# Patient Record
Sex: Female | Born: 1945 | Race: White | Hispanic: No | Smoking: Former smoker
Health system: Southern US, Community
[De-identification: ages and names within clinical notes are randomized; demographics above are authoritative.]

## PROBLEM LIST (undated history)

## (undated) DIAGNOSIS — D649 Anemia, unspecified: Secondary | ICD-10-CM

## (undated) DIAGNOSIS — E785 Hyperlipidemia, unspecified: Secondary | ICD-10-CM

## (undated) DIAGNOSIS — I2510 Atherosclerotic heart disease of native coronary artery without angina pectoris: Secondary | ICD-10-CM

## (undated) DIAGNOSIS — E790 Hyperuricemia without signs of inflammatory arthritis and tophaceous disease: Secondary | ICD-10-CM

## (undated) DIAGNOSIS — I4891 Unspecified atrial fibrillation: Secondary | ICD-10-CM

## (undated) DIAGNOSIS — I10 Essential (primary) hypertension: Secondary | ICD-10-CM

## (undated) DIAGNOSIS — N189 Chronic kidney disease, unspecified: Secondary | ICD-10-CM

## (undated) DIAGNOSIS — E039 Hypothyroidism, unspecified: Secondary | ICD-10-CM

## (undated) DIAGNOSIS — F32A Depression, unspecified: Secondary | ICD-10-CM

## (undated) DIAGNOSIS — M109 Gout, unspecified: Secondary | ICD-10-CM

## (undated) HISTORY — PX: TONSILLECTOMY: SUR1361

---

## 1993-01-13 HISTORY — PX: ABDOMINAL HYSTERECTOMY: SHX81

## 2004-09-12 ENCOUNTER — Ambulatory Visit: Payer: Self-pay | Admitting: General Practice

## 2006-05-12 ENCOUNTER — Ambulatory Visit: Payer: Self-pay | Admitting: Unknown Physician Specialty

## 2007-01-22 ENCOUNTER — Emergency Department: Payer: Self-pay | Admitting: Emergency Medicine

## 2007-01-22 ENCOUNTER — Other Ambulatory Visit: Payer: Self-pay

## 2007-03-16 ENCOUNTER — Other Ambulatory Visit: Payer: Self-pay

## 2007-03-16 ENCOUNTER — Emergency Department: Payer: Self-pay | Admitting: Internal Medicine

## 2007-06-22 ENCOUNTER — Ambulatory Visit: Payer: Self-pay

## 2007-06-26 ENCOUNTER — Emergency Department: Payer: Self-pay | Admitting: Emergency Medicine

## 2007-06-26 ENCOUNTER — Other Ambulatory Visit: Payer: Self-pay

## 2010-04-05 ENCOUNTER — Ambulatory Visit: Payer: Self-pay | Admitting: Unknown Physician Specialty

## 2010-05-22 ENCOUNTER — Ambulatory Visit: Payer: Self-pay | Admitting: Unknown Physician Specialty

## 2011-04-15 ENCOUNTER — Ambulatory Visit: Payer: Self-pay | Admitting: Internal Medicine

## 2013-02-22 ENCOUNTER — Ambulatory Visit: Payer: Self-pay | Admitting: Internal Medicine

## 2013-05-16 DIAGNOSIS — I4891 Unspecified atrial fibrillation: Secondary | ICD-10-CM | POA: Diagnosis present

## 2013-08-17 DIAGNOSIS — E785 Hyperlipidemia, unspecified: Secondary | ICD-10-CM | POA: Diagnosis present

## 2013-08-17 DIAGNOSIS — N289 Disorder of kidney and ureter, unspecified: Secondary | ICD-10-CM | POA: Insufficient documentation

## 2013-08-17 DIAGNOSIS — E039 Hypothyroidism, unspecified: Secondary | ICD-10-CM | POA: Insufficient documentation

## 2013-08-17 DIAGNOSIS — E790 Hyperuricemia without signs of inflammatory arthritis and tophaceous disease: Secondary | ICD-10-CM | POA: Insufficient documentation

## 2013-08-17 DIAGNOSIS — E038 Other specified hypothyroidism: Secondary | ICD-10-CM | POA: Insufficient documentation

## 2013-08-17 DIAGNOSIS — I10 Essential (primary) hypertension: Secondary | ICD-10-CM | POA: Diagnosis present

## 2014-01-13 HISTORY — PX: BREAST BIOPSY: SHX20

## 2014-02-22 DIAGNOSIS — Z0001 Encounter for general adult medical examination with abnormal findings: Secondary | ICD-10-CM | POA: Diagnosis not present

## 2014-02-22 DIAGNOSIS — Z1159 Encounter for screening for other viral diseases: Secondary | ICD-10-CM | POA: Diagnosis not present

## 2014-02-22 DIAGNOSIS — I10 Essential (primary) hypertension: Secondary | ICD-10-CM | POA: Diagnosis not present

## 2014-02-22 DIAGNOSIS — E780 Pure hypercholesterolemia: Secondary | ICD-10-CM | POA: Diagnosis not present

## 2014-02-22 DIAGNOSIS — E790 Hyperuricemia without signs of inflammatory arthritis and tophaceous disease: Secondary | ICD-10-CM | POA: Diagnosis not present

## 2014-02-22 DIAGNOSIS — I482 Chronic atrial fibrillation: Secondary | ICD-10-CM | POA: Diagnosis not present

## 2014-02-24 ENCOUNTER — Ambulatory Visit: Payer: Self-pay | Admitting: Internal Medicine

## 2014-02-24 DIAGNOSIS — Z1231 Encounter for screening mammogram for malignant neoplasm of breast: Secondary | ICD-10-CM | POA: Diagnosis not present

## 2014-04-25 ENCOUNTER — Ambulatory Visit
Admit: 2014-04-25 | Disposition: A | Discharge status: Home / Self Care | Payer: Self-pay | Attending: Internal Medicine | Admitting: Internal Medicine

## 2014-04-25 DIAGNOSIS — R928 Other abnormal and inconclusive findings on diagnostic imaging of breast: Secondary | ICD-10-CM | POA: Diagnosis not present

## 2014-04-25 DIAGNOSIS — R921 Mammographic calcification found on diagnostic imaging of breast: Secondary | ICD-10-CM | POA: Diagnosis not present

## 2014-05-04 ENCOUNTER — Ambulatory Visit
Admit: 2014-05-04 | Disposition: A | Discharge status: Home / Self Care | Payer: Self-pay | Attending: Internal Medicine | Admitting: Internal Medicine

## 2014-05-04 DIAGNOSIS — N6021 Fibroadenosis of right breast: Secondary | ICD-10-CM | POA: Diagnosis not present

## 2014-05-04 DIAGNOSIS — R921 Mammographic calcification found on diagnostic imaging of breast: Secondary | ICD-10-CM | POA: Diagnosis not present

## 2014-05-04 DIAGNOSIS — R928 Other abnormal and inconclusive findings on diagnostic imaging of breast: Secondary | ICD-10-CM | POA: Diagnosis not present

## 2014-05-08 LAB — SURGICAL PATHOLOGY

## 2014-06-09 DIAGNOSIS — I482 Chronic atrial fibrillation: Secondary | ICD-10-CM | POA: Diagnosis not present

## 2014-07-11 DIAGNOSIS — I482 Chronic atrial fibrillation: Secondary | ICD-10-CM | POA: Diagnosis not present

## 2014-08-09 DIAGNOSIS — I482 Chronic atrial fibrillation: Secondary | ICD-10-CM | POA: Diagnosis not present

## 2014-08-16 DIAGNOSIS — I482 Chronic atrial fibrillation: Secondary | ICD-10-CM | POA: Diagnosis not present

## 2014-08-16 DIAGNOSIS — I10 Essential (primary) hypertension: Secondary | ICD-10-CM | POA: Diagnosis not present

## 2014-08-31 DIAGNOSIS — E039 Hypothyroidism, unspecified: Secondary | ICD-10-CM | POA: Diagnosis not present

## 2014-08-31 DIAGNOSIS — I482 Chronic atrial fibrillation: Secondary | ICD-10-CM | POA: Diagnosis not present

## 2014-08-31 DIAGNOSIS — I10 Essential (primary) hypertension: Secondary | ICD-10-CM | POA: Diagnosis not present

## 2014-08-31 DIAGNOSIS — E790 Hyperuricemia without signs of inflammatory arthritis and tophaceous disease: Secondary | ICD-10-CM | POA: Diagnosis not present

## 2014-09-14 DIAGNOSIS — I482 Chronic atrial fibrillation: Secondary | ICD-10-CM | POA: Diagnosis not present

## 2014-10-12 DIAGNOSIS — I482 Chronic atrial fibrillation: Secondary | ICD-10-CM | POA: Diagnosis not present

## 2014-10-20 DIAGNOSIS — Z23 Encounter for immunization: Secondary | ICD-10-CM | POA: Diagnosis not present

## 2014-11-13 DIAGNOSIS — I482 Chronic atrial fibrillation: Secondary | ICD-10-CM | POA: Diagnosis not present

## 2014-12-13 DIAGNOSIS — H2513 Age-related nuclear cataract, bilateral: Secondary | ICD-10-CM | POA: Diagnosis not present

## 2014-12-13 DIAGNOSIS — H04129 Dry eye syndrome of unspecified lacrimal gland: Secondary | ICD-10-CM | POA: Diagnosis not present

## 2015-02-26 DIAGNOSIS — I10 Essential (primary) hypertension: Secondary | ICD-10-CM | POA: Diagnosis not present

## 2015-02-26 DIAGNOSIS — E039 Hypothyroidism, unspecified: Secondary | ICD-10-CM | POA: Diagnosis not present

## 2015-02-26 DIAGNOSIS — Z23 Encounter for immunization: Secondary | ICD-10-CM | POA: Diagnosis not present

## 2015-02-26 DIAGNOSIS — I482 Chronic atrial fibrillation: Secondary | ICD-10-CM | POA: Diagnosis not present

## 2015-02-26 DIAGNOSIS — Z0001 Encounter for general adult medical examination with abnormal findings: Secondary | ICD-10-CM | POA: Diagnosis not present

## 2015-02-26 DIAGNOSIS — E790 Hyperuricemia without signs of inflammatory arthritis and tophaceous disease: Secondary | ICD-10-CM | POA: Diagnosis not present

## 2015-02-26 DIAGNOSIS — E7800 Pure hypercholesterolemia, unspecified: Secondary | ICD-10-CM | POA: Diagnosis not present

## 2015-02-26 DIAGNOSIS — N289 Disorder of kidney and ureter, unspecified: Secondary | ICD-10-CM | POA: Diagnosis not present

## 2015-05-18 DIAGNOSIS — I10 Essential (primary) hypertension: Secondary | ICD-10-CM | POA: Diagnosis not present

## 2015-05-25 DIAGNOSIS — N289 Disorder of kidney and ureter, unspecified: Secondary | ICD-10-CM | POA: Diagnosis not present

## 2015-05-25 DIAGNOSIS — I482 Chronic atrial fibrillation: Secondary | ICD-10-CM | POA: Diagnosis not present

## 2015-05-25 DIAGNOSIS — I10 Essential (primary) hypertension: Secondary | ICD-10-CM | POA: Diagnosis not present

## 2015-08-28 DIAGNOSIS — I10 Essential (primary) hypertension: Secondary | ICD-10-CM | POA: Diagnosis not present

## 2015-08-28 DIAGNOSIS — I482 Chronic atrial fibrillation: Secondary | ICD-10-CM | POA: Diagnosis not present

## 2015-08-29 DIAGNOSIS — I10 Essential (primary) hypertension: Secondary | ICD-10-CM | POA: Diagnosis not present

## 2015-12-08 ENCOUNTER — Emergency Department
Admission: EM | Admit: 2015-12-08 | Discharge: 2015-12-08 | Disposition: A | Discharge status: Home / Self Care | Payer: Medicare HMO | Attending: Emergency Medicine | Admitting: Emergency Medicine

## 2015-12-08 ENCOUNTER — Emergency Department: Payer: Medicare HMO

## 2015-12-08 ENCOUNTER — Encounter: Payer: Self-pay | Admitting: Emergency Medicine

## 2015-12-08 DIAGNOSIS — R1084 Generalized abdominal pain: Secondary | ICD-10-CM | POA: Diagnosis not present

## 2015-12-08 DIAGNOSIS — I10 Essential (primary) hypertension: Secondary | ICD-10-CM | POA: Insufficient documentation

## 2015-12-08 DIAGNOSIS — Z79899 Other long term (current) drug therapy: Secondary | ICD-10-CM | POA: Insufficient documentation

## 2015-12-08 DIAGNOSIS — I2510 Atherosclerotic heart disease of native coronary artery without angina pectoris: Secondary | ICD-10-CM | POA: Insufficient documentation

## 2015-12-08 DIAGNOSIS — Z7982 Long term (current) use of aspirin: Secondary | ICD-10-CM | POA: Diagnosis not present

## 2015-12-08 DIAGNOSIS — Z87891 Personal history of nicotine dependence: Secondary | ICD-10-CM | POA: Insufficient documentation

## 2015-12-08 DIAGNOSIS — R112 Nausea with vomiting, unspecified: Secondary | ICD-10-CM | POA: Insufficient documentation

## 2015-12-08 DIAGNOSIS — R197 Diarrhea, unspecified: Secondary | ICD-10-CM | POA: Diagnosis not present

## 2015-12-08 DIAGNOSIS — K8020 Calculus of gallbladder without cholecystitis without obstruction: Secondary | ICD-10-CM | POA: Diagnosis not present

## 2015-12-08 HISTORY — DX: Essential (primary) hypertension: I10

## 2015-12-08 HISTORY — DX: Unspecified atrial fibrillation: I48.91

## 2015-12-08 HISTORY — DX: Atherosclerotic heart disease of native coronary artery without angina pectoris: I25.10

## 2015-12-08 LAB — CBC
HEMATOCRIT: 40 % (ref 35.0–47.0)
HEMOGLOBIN: 13.9 g/dL (ref 12.0–16.0)
MCH: 30.3 pg (ref 26.0–34.0)
MCHC: 34.7 g/dL (ref 32.0–36.0)
MCV: 87.2 fL (ref 80.0–100.0)
Platelets: 210 10*3/uL (ref 150–440)
RBC: 4.59 MIL/uL (ref 3.80–5.20)
RDW: 14.1 % (ref 11.5–14.5)
WBC: 9.9 10*3/uL (ref 3.6–11.0)

## 2015-12-08 LAB — COMPREHENSIVE METABOLIC PANEL
ALBUMIN: 4.1 g/dL (ref 3.5–5.0)
ALK PHOS: 91 U/L (ref 38–126)
ALT: 23 U/L (ref 14–54)
ANION GAP: 8 (ref 5–15)
AST: 30 U/L (ref 15–41)
BUN: 19 mg/dL (ref 6–20)
CO2: 29 mmol/L (ref 22–32)
Calcium: 9.9 mg/dL (ref 8.9–10.3)
Chloride: 100 mmol/L — ABNORMAL LOW (ref 101–111)
Creatinine, Ser: 1.03 mg/dL — ABNORMAL HIGH (ref 0.44–1.00)
GFR calc Af Amer: >60 mL/min (ref >60)
GFR calc non Af Amer: 53 mL/min — ABNORMAL LOW (ref >60)
GLUCOSE: 116 mg/dL — AB (ref 65–99)
POTASSIUM: 3.2 mmol/L — AB (ref 3.5–5.1)
SODIUM: 137 mmol/L (ref 135–145)
Total Bilirubin: 0.6 mg/dL (ref 0.3–1.2)
Total Protein: 7.3 g/dL (ref 6.5–8.1)

## 2015-12-08 LAB — URINALYSIS COMPLETE WITH MICROSCOPIC (ARMC ONLY)
BACTERIA UA: NONE SEEN
BILIRUBIN URINE: NEGATIVE
Glucose, UA: NEGATIVE mg/dL
Hgb urine dipstick: NEGATIVE
Ketones, ur: NEGATIVE mg/dL
Leukocytes, UA: NEGATIVE
NITRITE: NEGATIVE
Protein, ur: NEGATIVE mg/dL
Specific Gravity, Urine: 1.011 (ref 1.005–1.030)
pH: 7 (ref 5.0–8.0)

## 2015-12-08 LAB — LIPASE, BLOOD: Lipase: 29 U/L (ref 11–51)

## 2015-12-08 LAB — TROPONIN I

## 2015-12-08 MED ORDER — IOPAMIDOL (ISOVUE-300) INJECTION 61%
100.0000 mL | Freq: Once | INTRAVENOUS | Status: AC | PRN
  Administered 2015-12-08: 100 mL via INTRAVENOUS

## 2015-12-08 MED ORDER — SODIUM CHLORIDE 0.9 % IV BOLUS (SEPSIS)
500.0000 mL | Freq: Once | INTRAVENOUS | Status: AC
  Administered 2015-12-08: 500 mL via INTRAVENOUS

## 2015-12-08 MED ORDER — ONDANSETRON HCL 4 MG/2ML IJ SOLN
4.0000 mg | Freq: Once | INTRAMUSCULAR | Status: AC
  Administered 2015-12-08: 4 mg via INTRAVENOUS
  Filled 2015-12-08: qty 2

## 2015-12-08 MED ORDER — DICYCLOMINE HCL 20 MG PO TABS: 20.0000 mg | ORAL_TABLET | Freq: Three times a day (TID) | ORAL | 0 refills | Status: DC | PRN

## 2015-12-08 MED ORDER — IOPAMIDOL (ISOVUE-300) INJECTION 61%
30.0000 mL | Freq: Once | INTRAVENOUS | Status: AC | PRN
  Administered 2015-12-08: 30 mL via ORAL

## 2015-12-08 MED ORDER — ONDANSETRON HCL 4 MG PO TABS: 4.0000 mg | ORAL_TABLET | Freq: Three times a day (TID) | ORAL | 0 refills | Status: AC | PRN

## 2015-12-08 MED ORDER — HYDROMORPHONE HCL 1 MG/ML IJ SOLN
0.5000 mg | Freq: Once | INTRAMUSCULAR | Status: AC
  Administered 2015-12-08: 0.5 mg via INTRAVENOUS
  Filled 2015-12-08: qty 1

## 2015-12-08 NOTE — ED Triage Notes (Signed)
Having lower abd and back pain with n/v/ and d about 3 am

## 2015-12-08 NOTE — ED Provider Notes (Signed)
Time Seen: Approximately 1153  I have reviewed the triage notes  Chief Complaint: Abdominal Pain and Hematemesis   History of Present Illness: JAALA Allen is a 70 y.o. female *who describes diffuse abdominal pain with radiation toward the back since approximately 3 AM this morning. She states she's had some persistent nausea and dry heaves. One episode of emesis. To this historian she states it did not appear to be blood was likely some over-the-counter medication that she took. The patient points diffusely to the abdominal region and occasionally she'll point to the epigastric area as the initiation of her pain with pain toward her right flank and then on further questioning she'll point more toward the. Umbilical and lower middle quadrant as the source of discomfort. She's had for loose stools with no melena or hematochezia. She denies any chest pain or shortness of breath. She denies any urinary complaints.   Past Medical History:  Diagnosis Date  . Atrial fibrillation ([place])   . Coronary artery disease   . Hypertension     There are no active problems to display for this patient.   Past Surgical History:  Procedure Laterality Date  . ABDOMINAL HYSTERECTOMY      Past Surgical History:  Procedure Laterality Date  . ABDOMINAL HYSTERECTOMY    Complete hysterectomy    Allergies:  Patient has no known allergies.  Family History: No family history on file.  Social History: Social History  Substance Use Topics  . Smoking status: Former Research scientist (life sciences)  . Smokeless tobacco: Never Used  . Alcohol use No     Review of Systems:   10 point review of systems was performed and was otherwise negative:  Constitutional: No fever Eyes: No visual disturbances ENT: No sore throat, ear pain Cardiac: No chest pain Respiratory: No shortness of breath, wheezing, or stridor Abdomen: Diffuse abdominal pain with persistent nausea with vomiting 1. Loose stool Endocrine: No weight  loss, No night sweats Extremities: No peripheral edema, cyanosis Skin: No rashes, easy bruising Neurologic: No focal weakness, trouble with speech or swollowing Urologic: No dysuria, Hematuria, or urinary frequency Patient denies being on any recent antibiotics and denies travel  Physical Exam:  ED Triage Vitals [12/08/15 1130]  Enc Vitals Group     BP (!) 144/83     Pulse Rate 92     Resp 20     Temp 97.7 F (36.5 C)     Temp Source Oral     SpO2 98 %     Weight      Height      Head Circumference      Peak Flow      Pain Score 6     Pain Loc      Pain Edu?      Excl. in [place]?     General: Awake , Alert , and Oriented times 3; GCS 15 Head: Normal cephalic , atraumatic Eyes: Pupils equal , round, reactive to light Nose/Throat: No nasal drainage, patent upper airway without erythema or exudate.  Neck: Supple, Full range of motion, No anterior adenopathy or palpable thyroid masses Lungs: Clear to ascultation without wheezes , rhonchi, or rales Heart: Regular rate, regular rhythm without murmurs , gallops , or rubs Abdomen: Diffuse reproducible abdominal pain primarily epigastric and lower middle quadrant region. Bowel sounds are positive somewhat hyperactive in all 4 quadrants. No organosplenomegaly. Negative Murphy's sign or focal tenderness over McBurney's point.        Extremities: 2 plus  symmetric pulses. No edema, clubbing or cyanosis Neurologic: normal ambulation, Motor symmetric without deficits, sensory intact Skin: warm, dry, no rashes   Labs:   All laboratory work was reviewed including any pertinent negatives or positives listed below:  Labs Reviewed  CBC  LIPASE, [place] ([place])    EKG: * ED ECG REPORT I, Daymon Larsen, the attending physician, personally viewed and interpreted this ECG.  Date: 12/08/2015 EKG Time: 1216 Rate: 84 Rhythm: Atrial fibrillation QRS Axis:  normal Intervals: Right bundle-branch block ST/T Wave abnormalities: normal Conduction Disturbances: none Narrative Interpretation: unremarkable No acute ischemic changes  Radiology:  "Ct Abdomen Pelvis W Contrast  Result Date: 12/08/2015 CLINICAL DATA:  Epigastric pain with nausea and vomiting EXAM: CT ABDOMEN AND PELVIS WITH CONTRAST TECHNIQUE: Multidetector CT imaging of the abdomen and pelvis was performed using the standard protocol following bolus administration of intravenous contrast. CONTRAST:  161mL ISOVUE-300 IOPAMIDOL (ISOVUE-300) INJECTION 61% COMPARISON:  None. FINDINGS: Lower chest: No acute abnormality. Hepatobiliary: Scattered hypodensities are noted throughout the liver consistent with tiny cysts. They measure less than 1 cm. The gallbladder is within normal limits with the exception of a single dependent gallstone in the region of the gallbladder neck. No pericholecystic fluid or wall thickening is noted. Pancreas: Unremarkable. No pancreatic ductal dilatation or surrounding inflammatory changes. Spleen: Normal in size without focal abnormality. Adrenals/Urinary Tract: Adrenal glands are unremarkable. Kidneys are normal, without renal calculi, focal lesion, or hydronephrosis. Bladder is unremarkable. Stomach/Bowel: Stomach is within normal limits. Appendix appears normal. No evidence of bowel wall thickening, distention, or inflammatory changes. Mild diverticular changes noted. Vascular/Lymphatic: No significant vascular findings are present. No enlarged abdominal or pelvic lymph nodes. Reproductive: Status post hysterectomy. No adnexal masses. Other: No abdominal wall hernia or abnormality. No abdominopelvic ascites. Musculoskeletal: No acute or significant osseous findings. IMPRESSION: Cholelithiasis without complicating factors. Diverticulosis without diverticulitis. No acute abnormality noted. Electronically Signed   By: Inez Catalina M.D.   On: 12/08/2015 13:57  "   I personally  reviewed the radiologic studies    ED Course: Patient's stay here was uneventful. Patient has no signs of a surgical etiology for the abdominal pain per CAT scan evaluation. She was given some anti-medic therapy along with some pain medication for diffuse crampy abdominal discomfort. Patient feels symptomatically improved at this time and with her normal lab work and negative CAT scan and repeat exam being benign and I felt she could be treated on an outpatient basis. Clinical Course      Assessment: * Acute unspecified abdominal pain Gastroenteritis      Plan: * Outpatient " New Prescriptions   DICYCLOMINE (BENTYL) 20 MG TABLET    Take 1 tablet (20 mg total) by mouth 3 (three) times daily as needed for spasms.   ONDANSETRON (ZOFRAN) 4 MG TABLET    Take 1 tablet (4 mg total) by mouth every 8 (eight) hours as needed for nausea or vomiting.  " Patient was advised to return immediately if condition worsens. Patient was advised to follow up with their primary care physician or other specialized physicians involved in their outpatient care. The patient and/or family member/power of attorney had laboratory results reviewed at the bedside. All questions and concerns were addressed and appropriate discharge instructions were distributed by the nursing staff.            Daymon Larsen, MD 12/08/15 1455

## 2015-12-08 NOTE — Discharge Instructions (Signed)
Please return the emergency department especially for fever, uncontrolled vomiting, bloody diarrhea, or any other new concerns.  Please return immediately if condition worsens. Please contact her primary physician or the physician you were given for referral. If you have any specialist physicians involved in her treatment and plan please also contact them. Thank you for using [facility] regional emergency Department.

## 2015-12-11 DIAGNOSIS — H2513 Age-related nuclear cataract, bilateral: Secondary | ICD-10-CM | POA: Diagnosis not present

## 2015-12-11 DIAGNOSIS — H04129 Dry eye syndrome of unspecified lacrimal gland: Secondary | ICD-10-CM | POA: Diagnosis not present

## 2015-12-12 DIAGNOSIS — K8020 Calculus of gallbladder without cholecystitis without obstruction: Secondary | ICD-10-CM | POA: Diagnosis not present

## 2015-12-14 ENCOUNTER — Encounter: Payer: Self-pay | Admitting: Emergency Medicine

## 2015-12-14 ENCOUNTER — Inpatient Hospital Stay
Admission: EM | Admit: 2015-12-14 | Discharge: 2015-12-18 | DRG: 417 | Disposition: A | Discharge status: Home / Self Care | Payer: Commercial Managed Care - HMO | Attending: Internal Medicine | Admitting: Internal Medicine

## 2015-12-14 ENCOUNTER — Emergency Department: Payer: Commercial Managed Care - HMO

## 2015-12-14 DIAGNOSIS — K8000 Calculus of gallbladder with acute cholecystitis without obstruction: Secondary | ICD-10-CM | POA: Diagnosis present

## 2015-12-14 DIAGNOSIS — R7989 Other specified abnormal findings of blood chemistry: Secondary | ICD-10-CM | POA: Diagnosis not present

## 2015-12-14 DIAGNOSIS — R945 Abnormal results of liver function studies: Secondary | ICD-10-CM

## 2015-12-14 DIAGNOSIS — N390 Urinary tract infection, site not specified: Secondary | ICD-10-CM | POA: Diagnosis present

## 2015-12-14 DIAGNOSIS — Z8249 Family history of ischemic heart disease and other diseases of the circulatory system: Secondary | ICD-10-CM

## 2015-12-14 DIAGNOSIS — I2510 Atherosclerotic heart disease of native coronary artery without angina pectoris: Secondary | ICD-10-CM | POA: Diagnosis present

## 2015-12-14 DIAGNOSIS — E785 Hyperlipidemia, unspecified: Secondary | ICD-10-CM | POA: Diagnosis present

## 2015-12-14 DIAGNOSIS — K810 Acute cholecystitis: Secondary | ICD-10-CM | POA: Diagnosis not present

## 2015-12-14 DIAGNOSIS — Z79899 Other long term (current) drug therapy: Secondary | ICD-10-CM | POA: Diagnosis not present

## 2015-12-14 DIAGNOSIS — I4891 Unspecified atrial fibrillation: Secondary | ICD-10-CM | POA: Diagnosis present

## 2015-12-14 DIAGNOSIS — Z7982 Long term (current) use of aspirin: Secondary | ICD-10-CM

## 2015-12-14 DIAGNOSIS — K830 Cholangitis: Secondary | ICD-10-CM | POA: Diagnosis present

## 2015-12-14 DIAGNOSIS — E878 Other disorders of electrolyte and fluid balance, not elsewhere classified: Secondary | ICD-10-CM | POA: Diagnosis not present

## 2015-12-14 DIAGNOSIS — E876 Hypokalemia: Secondary | ICD-10-CM | POA: Diagnosis not present

## 2015-12-14 DIAGNOSIS — K819 Cholecystitis, unspecified: Secondary | ICD-10-CM | POA: Diagnosis not present

## 2015-12-14 DIAGNOSIS — K8020 Calculus of gallbladder without cholecystitis without obstruction: Secondary | ICD-10-CM | POA: Diagnosis not present

## 2015-12-14 DIAGNOSIS — K822 Perforation of gallbladder: Secondary | ICD-10-CM | POA: Diagnosis present

## 2015-12-14 DIAGNOSIS — Z880 Allergy status to penicillin: Secondary | ICD-10-CM

## 2015-12-14 DIAGNOSIS — N179 Acute kidney failure, unspecified: Secondary | ICD-10-CM | POA: Diagnosis present

## 2015-12-14 DIAGNOSIS — R935 Abnormal findings on diagnostic imaging of other abdominal regions, including retroperitoneum: Secondary | ICD-10-CM | POA: Diagnosis not present

## 2015-12-14 DIAGNOSIS — E860 Dehydration: Secondary | ICD-10-CM | POA: Diagnosis not present

## 2015-12-14 DIAGNOSIS — R1011 Right upper quadrant pain: Secondary | ICD-10-CM

## 2015-12-14 DIAGNOSIS — Z87891 Personal history of nicotine dependence: Secondary | ICD-10-CM | POA: Diagnosis not present

## 2015-12-14 DIAGNOSIS — K8012 Calculus of gallbladder with acute and chronic cholecystitis without obstruction: Secondary | ICD-10-CM | POA: Diagnosis not present

## 2015-12-14 DIAGNOSIS — I10 Essential (primary) hypertension: Secondary | ICD-10-CM | POA: Diagnosis present

## 2015-12-14 DIAGNOSIS — R109 Unspecified abdominal pain: Secondary | ICD-10-CM

## 2015-12-14 DIAGNOSIS — N3000 Acute cystitis without hematuria: Secondary | ICD-10-CM | POA: Diagnosis not present

## 2015-12-14 HISTORY — DX: Hyperlipidemia, unspecified: E78.5

## 2015-12-14 LAB — COMPREHENSIVE METABOLIC PANEL
ALBUMIN: 3.2 g/dL — AB (ref 3.5–5.0)
ALK PHOS: 215 U/L — AB (ref 38–126)
ALT: 37 U/L (ref 14–54)
AST: 57 U/L — ABNORMAL HIGH (ref 15–41)
Anion gap: 12 (ref 5–15)
BUN: 22 mg/dL — ABNORMAL HIGH (ref 6–20)
CALCIUM: 9.4 mg/dL (ref 8.9–10.3)
CO2: 27 mmol/L (ref 22–32)
CREATININE: 1.63 mg/dL — AB (ref 0.44–1.00)
Chloride: 88 mmol/L — ABNORMAL LOW (ref 101–111)
GFR calc Af Amer: 36 mL/min — ABNORMAL LOW (ref >60)
GFR calc non Af Amer: 31 mL/min — ABNORMAL LOW (ref >60)
GLUCOSE: 116 mg/dL — AB (ref 65–99)
Potassium: 2.9 mmol/L — ABNORMAL LOW (ref 3.5–5.1)
SODIUM: 127 mmol/L — AB (ref 135–145)
Total Bilirubin: 2.4 mg/dL — ABNORMAL HIGH (ref 0.3–1.2)
Total Protein: 7.2 g/dL (ref 6.5–8.1)

## 2015-12-14 LAB — URINALYSIS COMPLETE WITH MICROSCOPIC (ARMC ONLY)
Bilirubin Urine: NEGATIVE
GLUCOSE, UA: NEGATIVE mg/dL
Ketones, ur: NEGATIVE mg/dL
Nitrite: NEGATIVE
PROTEIN: 30 mg/dL — AB
Specific Gravity, Urine: 1.016 (ref 1.005–1.030)
pH: 5 (ref 5.0–8.0)

## 2015-12-14 LAB — CBC
HCT: 34.8 % — ABNORMAL LOW (ref 35.0–47.0)
HEMOGLOBIN: 11.9 g/dL — AB (ref 12.0–16.0)
MCH: 29.6 pg (ref 26.0–34.0)
MCHC: 34.2 g/dL (ref 32.0–36.0)
MCV: 86.7 fL (ref 80.0–100.0)
PLATELETS: 274 10*3/uL (ref 150–440)
RBC: 4.01 MIL/uL (ref 3.80–5.20)
RDW: 13.4 % (ref 11.5–14.5)
WBC: 12.4 10*3/uL — ABNORMAL HIGH (ref 3.6–11.0)

## 2015-12-14 LAB — LIPASE, BLOOD: Lipase: 35 U/L (ref 11–51)

## 2015-12-14 MED ORDER — POTASSIUM CHLORIDE CRYS ER 20 MEQ PO TBCR
20.0000 meq | EXTENDED_RELEASE_TABLET | Freq: Once | ORAL | Status: AC
  Administered 2015-12-14: 20 meq via ORAL
  Filled 2015-12-14: qty 1

## 2015-12-14 MED ORDER — CEFTRIAXONE SODIUM-DEXTROSE 1-3.74 GM-% IV SOLR
INTRAVENOUS | Status: AC
  Administered 2015-12-14: 1000 mg
  Filled 2015-12-14: qty 50

## 2015-12-14 MED ORDER — SODIUM CHLORIDE 0.9 % IV BOLUS (SEPSIS)
500.0000 mL | Freq: Once | INTRAVENOUS | Status: AC
  Administered 2015-12-14: 500 mL via INTRAVENOUS

## 2015-12-14 MED ORDER — DEXTROSE 5 % IV SOLN
1.0000 g | Freq: Once | INTRAVENOUS | Status: AC
  Administered 2015-12-14: 1 g via INTRAVENOUS

## 2015-12-14 NOTE — ED Triage Notes (Signed)
Pt reports ongoing RUQ and RLQ abdominal pain. Pt states has appt with general surgeon on 12/17 to discuss the need for surgery. Pt reports fever for 2-3 days up to 102.2. Pt reports when she takes tylenol her fever goes down. Pt denies NVD. Pt denies any cold or flu symptoms.

## 2015-12-14 NOTE — ED Notes (Signed)
Patient transported to Ultrasound 

## 2015-12-14 NOTE — ED Notes (Signed)
Pt's family requesting to speak to MD, MD made aware

## 2015-12-14 NOTE — ED Provider Notes (Signed)
Time Seen: Approximately 2114  I have reviewed the triage notes  Chief Complaint: Fever and Abdominal Pain   History of Present Illness: Robin LEVAR is a 70 y.o. female *who was brought back here to the emergency department secondary to some abdominal pain and a fever at home. Patient's had fever over the last couple of days up to 102.2. She points primarily to the epigastric area and right upper quadrant as the source of her discomfort. He evaluated the patient recently and she had an abdominal pelvic CT which showed cholelithiasis but no evidence of any acute abdominal disorder. She apparently has an upcoming appointment with a surgeon but has not been officially seen by a surgeon at this time. 69 chest pain or productive cough or wheezing.   Past Medical History:  Diagnosis Date  . Atrial fibrillation ([place])   . Coronary artery disease   . Hypertension     There are no active problems to display for this patient.   Past Surgical History:  Procedure Laterality Date  . ABDOMINAL HYSTERECTOMY      Past Surgical History:  Procedure Laterality Date  . ABDOMINAL HYSTERECTOMY      Current Outpatient Rx  . Order #: [id number] Class: Historical Med  . Order #: [id number] Class: Historical Med  . Order #: [id number] Class: Print  . Order #: [id number] Class: Historical Med  . Order #: [id number] Class: Historical Med  . Order #: [id number] Class: Print  . Order #: [id number] Class: Historical Med    Allergies:  Augmentin [amoxicillin-pot clavulanate]  Family History: No family history on file.  Social History: Social History  Substance Use Topics  . Smoking status: Former Research scientist (life sciences)  . Smokeless tobacco: Never Used  . Alcohol use No     Review of Systems:   10 point review of systems was performed and was otherwise negative:  Constitutional: No fever Eyes: No visual disturbances ENT: No sore throat, ear pain Cardiac: No chest pain Respiratory: No shortness of breath,  wheezing, or stridor Abdomen: Patient points primarily to the epigastric and toward her right upper quadrant as the source of her abdominal pain. She states currently her pain seems to be doing well. Endocrine: No weight loss, No night sweats Extremities: No peripheral edema, cyanosis Skin: No rashes, easy bruising Neurologic: No focal weakness, trouble with speech or swollowing Urologic: No dysuria, Hematuria, or urinary frequency   Physical Exam:  ED Triage Vitals  Enc Vitals Group     BP 12/14/15 1903 121/79     Pulse Rate 12/14/15 1903 (!) 109     Resp 12/14/15 1903 (!) 178     Temp 12/14/15 1903 98.2 F (36.8 C)     Temp Source 12/14/15 1903 Oral     SpO2 12/14/15 1903 98 %     Weight 12/14/15 1904 181 lb (82.1 kg)     Height 12/14/15 1904 5\' 5"  (1.651 m)     Head Circumference --      Peak Flow --      Pain Score 12/14/15 1904 7     Pain Loc --      Pain Edu? --      Excl. in [place]? --     General: Awake , Alert , and Oriented times 3; GCS 15 Head: Normal cephalic , atraumatic Eyes: Pupils equal , round, reactive to light Nose/Throat: No nasal drainage, patent upper airway without erythema or exudate.  Neck: Supple, Full range of motion, No anterior adenopathy or palpable thyroid masses Lungs: Clear  to ascultation without wheezes , rhonchi, or rales Heart: Regular rate, regular rhythm without murmurs , gallops , or rubs Abdomen:Patient has tenderness epigastric right upper quadrant without rebound guarding rigidity or given can O splenomegaly. Bowel sounds are positive and symmetric in all 4 areas.     Extremities: 2 plus symmetric pulses. No edema, clubbing or cyanosis Neurologic: normal ambulation, Motor symmetric without deficits, sensory intact Skin: warm, dry, no rashes   Labs:   All laboratory work was reviewed including any pertinent negatives or positives listed below:  Labs Reviewed  COMPREHENSIVE METABOLIC PANEL - Abnormal; Notable for the following:        Result Value   Sodium 127 (*)    Potassium 2.9 (*)    Chloride 88 (*)    Glucose, Bld 116 (*)    BUN 22 (*)    Creatinine, Ser 1.63 (*)    Albumin 3.2 (*)    AST 57 (*)    Alkaline Phosphatase 215 (*)    Total Bilirubin 2.4 (*)    GFR calc non Af Amer 31 (*)    GFR calc Af Amer 36 (*)    All other components within normal limits  CBC - Abnormal; Notable for the following:    WBC 12.4 (*)    Hemoglobin 11.9 (*)    HCT 34.8 (*)    All other components within normal limits  URINALYSIS COMPLETEWITH MICROSCOPIC ([facility] ONLY) - Abnormal; Notable for the following:    Color, Urine AMBER (*)    APPearance CLEAR (*)    Hgb urine dipstick 1+ (*)    Protein, ur 30 (*)    Leukocytes, UA 3+ (*)    Bacteria, UA RARE (*)    Squamous Epithelial / LPF 0-5 (*)    All other components within normal limits  URINE CULTURE  LIPASE, BLOOD  Lab work shows a low sodium and an increasing creatinine from previous labs. She also has some elevated liver function panels with catheter alkaline phosphatase of 215 and total bilirubin of 2.4 white blood cell count is also elevated in comparison with signs of urinary tract infection. Urine culture is pending.   Radiology:  "Ct Abdomen Pelvis W Contrast  Result Date: 12/08/2015 CLINICAL DATA:  Epigastric pain with nausea and vomiting EXAM: CT ABDOMEN AND PELVIS WITH CONTRAST TECHNIQUE: Multidetector CT imaging of the abdomen and pelvis was performed using the standard protocol following bolus administration of intravenous contrast. CONTRAST:  120mL ISOVUE-300 IOPAMIDOL (ISOVUE-300) INJECTION 61% COMPARISON:  None. FINDINGS: Lower chest: No acute abnormality. Hepatobiliary: Scattered hypodensities are noted throughout the liver consistent with tiny cysts. They measure less than 1 cm. The gallbladder is within normal limits with the exception of a single dependent gallstone in the region of the gallbladder neck. No pericholecystic fluid or wall thickening is noted.  Pancreas: Unremarkable. No pancreatic ductal dilatation or surrounding inflammatory changes. Spleen: Normal in size without focal abnormality. Adrenals/Urinary Tract: Adrenal glands are unremarkable. Kidneys are normal, without renal calculi, focal lesion, or hydronephrosis. Bladder is unremarkable. Stomach/Bowel: Stomach is within normal limits. Appendix appears normal. No evidence of bowel wall thickening, distention, or inflammatory changes. Mild diverticular changes noted. Vascular/Lymphatic: No significant vascular findings are present. No enlarged abdominal or pelvic lymph nodes. Reproductive: Status post hysterectomy. No adnexal masses. Other: No abdominal wall hernia or abnormality. No abdominopelvic ascites. Musculoskeletal: No acute or significant osseous findings. IMPRESSION: Cholelithiasis without complicating factors. Diverticulosis without diverticulitis. No acute abnormality noted. Electronically Signed   By: Elta [place]  Lukens M.D.   On: 12/08/2015 13:57   US Abdomen Limited Ruq  Result Date: 12/14/2015 CLINICAL DATA:  Right upper quadrant pain EXAM: US ABDOMEN LIMITED - RIGHT UPPER QUADRANT COMPARISON:  CT 12/08/2015 FINDINGS: Gallbladder: Gallbladder is slightly dilated measuring up to 4.6 cm in diameter. Moderate sludge is present within the lumen with small echodense stones. Additional echodense foci within the wall of the gallbladder compatible with adenomyomatosis. Gallbladder wall is slightly thickened at 4 mm. Small amount of peri cholecystic fluid. Sonographer reports positive sonographic Murphy's sign. Questionable focal defect within the inferior wall of the gallbladder with surrounding sludge. Common bile duct: Diameter: Normal at 3.1 mm Liver: No focal lesion identified. Within normal limits in parenchymal echogenicity. Cysts noted on prior CT are not well demonstrated. IMPRESSION: 1. Dilated gallbladder containing moderate sludge and stones. There is wall thickening, small peri  cholecystic fluid and positive sonographic Murphy's, collective findings would be suspicious for acute cholecystitis. Questionable small focal area of wall discontinuity involving the dependent portion of the gallbladder, a small perforation cannot be excluded. 2. No biliary dilatation Electronically Signed   By: Donavan Foil M.D.   On: 12/14/2015 23:18  "  I personally reviewed the radiologic studies     ED Course:  Patient does have an obvious urinary tract infection and urine culture was added and the patient received a gram of Rocephin IV. Equivocal findings per ultrasound evaluation but certainly laboratory work that appears to be some form of biliary tract disease. The patient's case was reviewed with general surgery and possible internal medicine admission for correction of electrolytes, etc. And otherwise has some occasional discomfort but otherwise appears to be comfortable at this time and tolerated her IV antibiotics well Clinical Course      Assessment: * Acute unspecified abdominal pain Urinary tract infection  Final Clinical Impression:   Final diagnoses:  Abdominal pain     Plan: Inpatient management*            Daymon Larsen, MD 12/14/15 2338

## 2015-12-15 ENCOUNTER — Encounter: Payer: Self-pay | Admitting: Surgery

## 2015-12-15 ENCOUNTER — Inpatient Hospital Stay: Payer: Commercial Managed Care - HMO

## 2015-12-15 DIAGNOSIS — E878 Other disorders of electrolyte and fluid balance, not elsewhere classified: Secondary | ICD-10-CM | POA: Insufficient documentation

## 2015-12-15 DIAGNOSIS — K830 Cholangitis: Secondary | ICD-10-CM | POA: Diagnosis present

## 2015-12-15 DIAGNOSIS — E876 Hypokalemia: Secondary | ICD-10-CM | POA: Diagnosis not present

## 2015-12-15 DIAGNOSIS — K8020 Calculus of gallbladder without cholecystitis without obstruction: Secondary | ICD-10-CM | POA: Diagnosis not present

## 2015-12-15 DIAGNOSIS — K810 Acute cholecystitis: Secondary | ICD-10-CM | POA: Diagnosis not present

## 2015-12-15 DIAGNOSIS — E860 Dehydration: Secondary | ICD-10-CM | POA: Insufficient documentation

## 2015-12-15 DIAGNOSIS — N390 Urinary tract infection, site not specified: Secondary | ICD-10-CM | POA: Diagnosis present

## 2015-12-15 DIAGNOSIS — R1011 Right upper quadrant pain: Secondary | ICD-10-CM | POA: Diagnosis present

## 2015-12-15 DIAGNOSIS — E785 Hyperlipidemia, unspecified: Secondary | ICD-10-CM | POA: Diagnosis present

## 2015-12-15 DIAGNOSIS — Z880 Allergy status to penicillin: Secondary | ICD-10-CM | POA: Diagnosis not present

## 2015-12-15 DIAGNOSIS — K8000 Calculus of gallbladder with acute cholecystitis without obstruction: Secondary | ICD-10-CM | POA: Diagnosis present

## 2015-12-15 DIAGNOSIS — R7989 Other specified abnormal findings of blood chemistry: Secondary | ICD-10-CM

## 2015-12-15 DIAGNOSIS — N179 Acute kidney failure, unspecified: Secondary | ICD-10-CM | POA: Diagnosis present

## 2015-12-15 DIAGNOSIS — I2510 Atherosclerotic heart disease of native coronary artery without angina pectoris: Secondary | ICD-10-CM | POA: Diagnosis present

## 2015-12-15 DIAGNOSIS — N3000 Acute cystitis without hematuria: Secondary | ICD-10-CM | POA: Diagnosis not present

## 2015-12-15 DIAGNOSIS — I4891 Unspecified atrial fibrillation: Secondary | ICD-10-CM | POA: Diagnosis present

## 2015-12-15 DIAGNOSIS — Z87891 Personal history of nicotine dependence: Secondary | ICD-10-CM | POA: Diagnosis not present

## 2015-12-15 DIAGNOSIS — K822 Perforation of gallbladder: Secondary | ICD-10-CM | POA: Diagnosis present

## 2015-12-15 DIAGNOSIS — I10 Essential (primary) hypertension: Secondary | ICD-10-CM | POA: Diagnosis present

## 2015-12-15 DIAGNOSIS — Z7982 Long term (current) use of aspirin: Secondary | ICD-10-CM | POA: Diagnosis not present

## 2015-12-15 DIAGNOSIS — R945 Abnormal results of liver function studies: Secondary | ICD-10-CM

## 2015-12-15 DIAGNOSIS — Z8249 Family history of ischemic heart disease and other diseases of the circulatory system: Secondary | ICD-10-CM | POA: Diagnosis not present

## 2015-12-15 DIAGNOSIS — Z79899 Other long term (current) drug therapy: Secondary | ICD-10-CM | POA: Diagnosis not present

## 2015-12-15 LAB — BASIC METABOLIC PANEL
ANION GAP: 9 (ref 5–15)
BUN: 18 mg/dL (ref 6–20)
CHLORIDE: 94 mmol/L — AB (ref 101–111)
CO2: 27 mmol/L (ref 22–32)
Calcium: 8.9 mg/dL (ref 8.9–10.3)
Creatinine, Ser: 1.2 mg/dL — ABNORMAL HIGH (ref 0.44–1.00)
GFR calc Af Amer: 51 mL/min — ABNORMAL LOW (ref >60)
GFR, EST NON AFRICAN AMERICAN: 44 mL/min — AB (ref >60)
GLUCOSE: 101 mg/dL — AB (ref 65–99)
POTASSIUM: 2.5 mmol/L — AB (ref 3.5–5.1)
SODIUM: 130 mmol/L — AB (ref 135–145)

## 2015-12-15 LAB — HEPATIC FUNCTION PANEL
ALBUMIN: 2.8 g/dL — AB (ref 3.5–5.0)
ALK PHOS: 185 U/L — AB (ref 38–126)
ALT: 30 U/L (ref 14–54)
AST: 42 U/L — AB (ref 15–41)
BILIRUBIN DIRECT: 0.9 mg/dL — AB (ref 0.1–0.5)
BILIRUBIN TOTAL: 1.9 mg/dL — AB (ref 0.3–1.2)
Indirect Bilirubin: 1 mg/dL — ABNORMAL HIGH (ref 0.3–0.9)
Total Protein: 6.1 g/dL — ABNORMAL LOW (ref 6.5–8.1)

## 2015-12-15 LAB — CBC
HEMATOCRIT: 33.2 % — AB (ref 35.0–47.0)
HEMOGLOBIN: 11.4 g/dL — AB (ref 12.0–16.0)
MCH: 29.6 pg (ref 26.0–34.0)
MCHC: 34.2 g/dL (ref 32.0–36.0)
MCV: 86.5 fL (ref 80.0–100.0)
Platelets: 253 10*3/uL (ref 150–440)
RBC: 3.84 MIL/uL (ref 3.80–5.20)
RDW: 13.7 % (ref 11.5–14.5)
WBC: 11.7 10*3/uL — AB (ref 3.6–11.0)

## 2015-12-15 LAB — MAGNESIUM: Magnesium: 1.8 mg/dL (ref 1.7–2.4)

## 2015-12-15 MED ORDER — SODIUM CHLORIDE 0.9% FLUSH
3.0000 mL | Freq: Two times a day (BID) | INTRAVENOUS | Status: DC
  Administered 2015-12-17 – 2015-12-18 (×3): 3 mL via INTRAVENOUS

## 2015-12-15 MED ORDER — POLYETHYLENE GLYCOL 3350 17 G PO PACK
17.0000 g | PACK | Freq: Every day | ORAL | Status: DC
  Administered 2015-12-15 – 2015-12-18 (×3): 17 g via ORAL
  Filled 2015-12-15 (×3): qty 1

## 2015-12-15 MED ORDER — GADOBENATE DIMEGLUMINE 529 MG/ML IV SOLN
20.0000 mL | Freq: Once | INTRAVENOUS | Status: AC | PRN
  Administered 2015-12-15: 17 mL via INTRAVENOUS

## 2015-12-15 MED ORDER — HEPARIN SODIUM (PORCINE) 5000 UNIT/ML IJ SOLN
5000.0000 [IU] | Freq: Three times a day (TID) | INTRAMUSCULAR | Status: DC
  Administered 2015-12-15 – 2015-12-17 (×8): 5000 [IU] via SUBCUTANEOUS
  Filled 2015-12-15 (×9): qty 1

## 2015-12-15 MED ORDER — ACETAMINOPHEN 650 MG RE SUPP: 650.0000 mg | Freq: Four times a day (QID) | RECTAL | Status: DC | PRN

## 2015-12-15 MED ORDER — CEFTRIAXONE SODIUM-DEXTROSE 2-2.22 GM-% IV SOLR
2.0000 g | INTRAVENOUS | Status: DC
  Administered 2015-12-15 – 2015-12-17 (×3): 2 g via INTRAVENOUS
  Filled 2015-12-15 (×3): qty 50

## 2015-12-15 MED ORDER — ACETAMINOPHEN 325 MG PO TABS
ORAL_TABLET | ORAL | Status: AC
  Administered 2015-12-15: 650 mg via ORAL
  Filled 2015-12-15: qty 2

## 2015-12-15 MED ORDER — DOCUSATE SODIUM 100 MG PO CAPS
100.0000 mg | ORAL_CAPSULE | Freq: Two times a day (BID) | ORAL | Status: DC
  Administered 2015-12-15 – 2015-12-18 (×5): 100 mg via ORAL
  Filled 2015-12-15 (×6): qty 1

## 2015-12-15 MED ORDER — MAGNESIUM SULFATE 2 GM/50ML IV SOLN
2.0000 g | Freq: Once | INTRAVENOUS | Status: AC
  Administered 2015-12-15: 2 g via INTRAVENOUS
  Filled 2015-12-15: qty 50

## 2015-12-15 MED ORDER — POTASSIUM CHLORIDE IN NACL 20-0.9 MEQ/L-% IV SOLN
INTRAVENOUS | Status: DC
  Administered 2015-12-15 – 2015-12-17 (×4): via INTRAVENOUS
  Filled 2015-12-15 (×6): qty 1000

## 2015-12-15 MED ORDER — ALPRAZOLAM 0.5 MG PO TABS
0.5000 mg | ORAL_TABLET | Freq: Once | ORAL | Status: AC
  Administered 2015-12-15: 0.5 mg via ORAL
  Filled 2015-12-15: qty 1

## 2015-12-15 MED ORDER — ONDANSETRON HCL 4 MG/2ML IJ SOLN
4.0000 mg | Freq: Four times a day (QID) | INTRAMUSCULAR | Status: DC | PRN
  Administered 2015-12-17 (×2): 4 mg via INTRAVENOUS
  Filled 2015-12-15: qty 2

## 2015-12-15 MED ORDER — POTASSIUM CHLORIDE CRYS ER 20 MEQ PO TBCR
40.0000 meq | EXTENDED_RELEASE_TABLET | ORAL | Status: AC
  Administered 2015-12-15 (×2): 40 meq via ORAL
  Filled 2015-12-15 (×2): qty 2

## 2015-12-15 MED ORDER — ALLOPURINOL 100 MG PO TABS
100.0000 mg | ORAL_TABLET | Freq: Every day | ORAL | Status: DC
  Administered 2015-12-15 – 2015-12-18 (×3): 100 mg via ORAL
  Filled 2015-12-15 (×3): qty 1

## 2015-12-15 MED ORDER — METOPROLOL TARTRATE 50 MG PO TABS
50.0000 mg | ORAL_TABLET | Freq: Two times a day (BID) | ORAL | Status: DC
  Administered 2015-12-15 – 2015-12-18 (×7): 50 mg via ORAL
  Filled 2015-12-15 (×8): qty 1

## 2015-12-15 MED ORDER — ACETAMINOPHEN 325 MG PO TABS
650.0000 mg | ORAL_TABLET | Freq: Four times a day (QID) | ORAL | Status: DC | PRN
  Administered 2015-12-15 (×2): 650 mg via ORAL
  Filled 2015-12-15: qty 2

## 2015-12-15 MED ORDER — OXYCODONE HCL 5 MG PO TABS
5.0000 mg | ORAL_TABLET | ORAL | Status: DC | PRN
  Filled 2015-12-15 (×2): qty 1

## 2015-12-15 MED ORDER — ASPIRIN EC 81 MG PO TBEC
81.0000 mg | DELAYED_RELEASE_TABLET | Freq: Every day | ORAL | Status: DC
  Administered 2015-12-15 – 2015-12-18 (×3): 81 mg via ORAL
  Filled 2015-12-15 (×3): qty 1

## 2015-12-15 MED ORDER — ONDANSETRON HCL 4 MG PO TABS
4.0000 mg | ORAL_TABLET | Freq: Four times a day (QID) | ORAL | Status: DC | PRN
  Administered 2015-12-18: 4 mg via ORAL
  Filled 2015-12-15: qty 1

## 2015-12-15 MED ORDER — SODIUM CHLORIDE 0.9 % IV SOLN
INTRAVENOUS | Status: DC
  Administered 2015-12-15: 03:00:00 via INTRAVENOUS

## 2015-12-15 NOTE — ED Notes (Signed)
Admitting Provider at bedside. 

## 2015-12-15 NOTE — Progress Notes (Signed)
Pt arrived to floor via stretcher from ER. Daughter at bedside. Telemetry applied and called to CCMD. Pt. Is A&O with no complaints of pain at this time. Skin intact. Given room orientation, instructed on how to use the call bell and ascom system.

## 2015-12-15 NOTE — Progress Notes (Signed)
[facility] NAME: Robin Allen    MRN#:  [id number]  DATE OF BIRTH:  [birth year]  SUBJECTIVE:  Hospital Day: 0 days Robin Allen is a 70 y.o. female presenting with Fever and Abdominal Pain .   Overnight events: no acute overnight events  Interval Events: no complaints this morning other than dark urine  REVIEW OF SYSTEMS:  CONSTITUTIONAL: No fever, fatigue or weakness.  EYES: No blurred or double vision.  EARS, NOSE, AND THROAT: No tinnitus or ear pain.  RESPIRATORY: No cough, shortness of breath, wheezing or hemoptysis.  CARDIOVASCULAR: No chest pain, orthopnea, edema.  GASTROINTESTINAL: No nausea, vomiting, diarrhea or abdominal pain.  GENITOURINARY: No dysuria, hematuria.  ENDOCRINE: No polyuria, nocturia,  HEMATOLOGY: No anemia, easy bruising or bleeding SKIN: No rash or lesion. MUSCULOSKELETAL: No joint pain or arthritis.   NEUROLOGIC: No tingling, numbness, weakness.  PSYCHIATRY: No anxiety or depression.   DRUG ALLERGIES:   Allergies  Allergen Reactions  . Augmentin [Amoxicillin-Pot Clavulanate]     nausea    VITALS:  Blood pressure 111/70, pulse 80, temperature 97.9 F (36.6 C), temperature source Oral, resp. rate 18, height 5\' 5"  (1.651 m), weight 85.2 kg (187 lb 14.4 oz), SpO2 99 %.  PHYSICAL EXAMINATION:  VITAL SIGNS: Vitals:   12/15/15 0734 12/15/15 1154  BP: 120/81 111/70  Pulse: 86 80  Resp: (!) 26 18  Temp: 98.1 F (36.7 C) 97.9 F (36.6 C)   GENERAL:70 y.o.female currently in no acute distress.  HEAD: Normocephalic, atraumatic.  EYES: Pupils equal, round, reactive to light. Extraocular muscles intact. No scleral icterus.  MOUTH: Moist mucosal membrane. Dentition intact. No abscess noted.  EAR, NOSE, THROAT: Clear without exudates. No external lesions.  NECK: Supple. No thyromegaly. No nodules. No JVD.  PULMONARY: Clear to ascultation, without wheeze rails or rhonci. No use of  accessory muscles, Good respiratory effort. good air entry bilaterally CHEST: Nontender to palpation.  CARDIOVASCULAR: S1 and S2. Regular rate and rhythm. No murmurs, rubs, or gallops. No edema. Pedal pulses 2+ bilaterally.  GASTROINTESTINAL: Soft, some RUQ tenderness, nondistended. No masses. Positive bowel sounds. No hepatosplenomegaly.  MUSCULOSKELETAL: No swelling, clubbing, or edema. Range of motion full in all extremities.  NEUROLOGIC: Cranial nerves II through XII are intact. No gross focal neurological deficits. Sensation intact. Reflexes intact.  SKIN: No ulceration, lesions, rashes, or cyanosis. Skin warm and dry. Turgor intact.  PSYCHIATRIC: Mood, affect within normal limits. The patient is awake, alert and oriented x 3. Insight, judgment intact.      LABORATORY PANEL:   CBC  Recent Labs Lab 12/15/15 0440  WBC 11.7*  HGB 11.4*  HCT 33.2*  PLT 253   ------------------------------------------------------------------------------------------------------------------  Chemistries   Recent Labs Lab 12/15/15 0440  NA 130*  K 2.5*  CL 94*  CO2 27  GLUCOSE 101*  BUN 18  CREATININE 1.20*  CALCIUM 8.9  MG 1.8  AST 42*  ALT 30  ALKPHOS 185*  BILITOT 1.9*   ------------------------------------------------------------------------------------------------------------------  Cardiac Enzymes No results for input(s): TROPONINI in the last 168 hours. ------------------------------------------------------------------------------------------------------------------  RADIOLOGY:  US Abdomen Limited Ruq  Result Date: 12/14/2015 CLINICAL DATA:  Right upper quadrant pain EXAM: US ABDOMEN LIMITED - RIGHT UPPER QUADRANT COMPARISON:  CT 12/08/2015 FINDINGS: Gallbladder: Gallbladder is slightly dilated measuring up to 4.6 cm in diameter. Moderate sludge is present within the lumen with small echodense stones. Additional echodense foci within the wall of the gallbladder compatible  with adenomyomatosis.  Gallbladder wall is slightly thickened at 4 mm. Small amount of peri cholecystic fluid. Sonographer reports positive sonographic Murphy's sign. Questionable focal defect within the inferior wall of the gallbladder with surrounding sludge. Common bile duct: Diameter: Normal at 3.1 mm Liver: No focal lesion identified. Within normal limits in parenchymal echogenicity. Cysts noted on prior CT are not well demonstrated. IMPRESSION: 1. Dilated gallbladder containing moderate sludge and stones. There is wall thickening, small peri cholecystic fluid and positive sonographic Murphy's, collective findings would be suspicious for acute cholecystitis. Questionable small focal area of wall discontinuity involving the dependent portion of the gallbladder, a small perforation cannot be excluded. 2. No biliary dilatation Electronically Signed   By: Donavan Foil M.D.   On: 12/14/2015 23:18    EKG:   Orders placed or performed during the hospital encounter of 12/08/15  . ED EKG  . ED EKG    ASSESSMENT AND PLAN:   Robin Allen is a 70 y.o. female presenting with Fever and Abdominal Pain . Admitted 12/14/2015 : Day #: 0 days   1. UTI: continue current antibiotics, follow culture data 2. Cholelithiasis with hyperbilirubinemia: for MRCP per surgery today 3. Hypokalemia: check mag replace K goal 4-5 4. Hypertension: hold htcz continue lopressor   All the records are reviewed and case discussed with Care Management/Social Workerr. Management plans discussed with the patient, family and they are in agreement.  CODE STATUS: full TOTAL TIME TAKING CARE OF THIS PATIENT: 33 minutes.   POSSIBLE D/C IN 2-3DAYS, DEPENDING ON CLINICAL CONDITION.   Hower,  Karenann Cai.D on 12/15/2015 at 12:40 PM  Between 7am to 6pm - Pager - [phone]  After 6pm: House Pager: - [facility]  Office  [phone]  CC: Primary care physician; Madelyn Brunner, MD

## 2015-12-15 NOTE — H&P (Signed)
Robin Allen is an 70 y.o. female.   Chief Complaint: fever, chills, malaise HPI: 70 yr old with multiple medical issues of Afib and HTN well controlled with Metoprolol and ASA, recently in ED on 11/25 with symptomatic cholelithiasis.  Patient states still had some pain off and on in the epigastrium and right up quadrant.She did have pain that moved through to her back in the subscapular area as well.  Patient states that the pain has gradually improved with Bentyl, zofran and tylenol.  She has not had pain today at all.  She was supposed to see a Dr. Tamala Julian for surgery as an outpatient on 12/18.  She had a decreased appetite but has been able to eat and drink.  She denies any nausea, vomiting, diarrhea, constipation, dysuria, hematuria or urgency or frequency of urination.  She did state that she lost about 8 lbs over the past week as well.  She had been drinking Gatorade some as well. She also denies any lower back at all.  She then had a fever of 102 today and spoke with her PCP office who instructed her to be seen again in the emergency room.   Past Medical History:  Diagnosis Date  . Atrial fibrillation ([place])   . Coronary artery disease   . Hyperlipidemia   . Hypertension     Past Surgical History:  Procedure Laterality Date  . ABDOMINAL HYSTERECTOMY  1995   Total, lower midline incision    Family History  Problem Relation Age of Onset  . Basal cell carcinoma Mother   . Hypertension Mother   . Melanoma Father   . Hypertension Father    Social History:  reports that she has quit smoking. She has a 3.00 pack-year smoking history. She has never used smokeless tobacco. She reports that she does not drink alcohol or use drugs.  Allergies:  Allergies  Allergen Reactions  . Augmentin [Amoxicillin-Pot Clavulanate]     nausea     (Not in a hospital admission)  Results for orders placed or performed during the hospital encounter of 12/14/15 (from the past 48 hour(s))  Lipase,  blood     Status: None   Collection Time: 12/14/15  7:06 PM  Result Value Ref Range   Lipase 35 11 - 51 U/L  Comprehensive metabolic panel     Status: Abnormal   Collection Time: 12/14/15  7:06 PM  Result Value Ref Range   Sodium 127 (L) 135 - 145 mmol/L   Potassium 2.9 (L) 3.5 - 5.1 mmol/L   Chloride 88 (L) 101 - 111 mmol/L   CO2 27 22 - 32 mmol/L   Glucose, Bld 116 (H) 65 - 99 mg/dL   BUN 22 (H) 6 - 20 mg/dL   Creatinine, Ser 1.63 (H) 0.44 - 1.00 mg/dL   Calcium 9.4 8.9 - 10.3 mg/dL   Total Protein 7.2 6.5 - 8.1 g/dL   Albumin 3.2 (L) 3.5 - 5.0 g/dL   AST 57 (H) 15 - 41 U/L   ALT 37 14 - 54 U/L   Alkaline Phosphatase 215 (H) 38 - 126 U/L   Total Bilirubin 2.4 (H) 0.3 - 1.2 mg/dL   GFR calc non Af Amer 31 (L) >60 mL/min   GFR calc Af Amer 36 (L) >60 mL/min    Comment: (NOTE) The eGFR has been calculated using the CKD EPI equation. This calculation has not been validated in all clinical situations. eGFR's persistently <60 mL/min signify possible Chronic Kidney Disease.  Anion gap 12 5 - 15  CBC     Status: Abnormal   Collection Time: 12/14/15  7:06 PM  Result Value Ref Range   WBC 12.4 (H) 3.6 - 11.0 K/uL   RBC 4.01 3.80 - 5.20 MIL/uL   Hemoglobin 11.9 (L) 12.0 - 16.0 g/dL   HCT 34.8 (L) 35.0 - 47.0 %   MCV 86.7 80.0 - 100.0 fL   MCH 29.6 26.0 - 34.0 pg   MCHC 34.2 32.0 - 36.0 g/dL   RDW 13.4 11.5 - 14.5 %   Platelets 274 150 - 440 K/uL  Urinalysis complete, with microscopic     Status: Abnormal   Collection Time: 12/14/15  7:10 PM  Result Value Ref Range   Color, Urine AMBER (A) YELLOW   APPearance CLEAR (A) CLEAR   Glucose, UA NEGATIVE NEGATIVE mg/dL   Bilirubin Urine NEGATIVE NEGATIVE   Ketones, ur NEGATIVE NEGATIVE mg/dL   Specific Gravity, Urine 1.016 1.005 - 1.030   Hgb urine dipstick 1+ (A) NEGATIVE   pH 5.0 5.0 - 8.0   Protein, ur 30 (A) NEGATIVE mg/dL   Nitrite NEGATIVE NEGATIVE   Leukocytes, UA 3+ (A) NEGATIVE   RBC / HPF 6-30 0 - 5 RBC/hpf   WBC,  UA TOO NUMEROUS TO COUNT 0 - 5 WBC/hpf   Bacteria, UA RARE (A) NONE SEEN   Squamous Epithelial / LPF 0-5 (A) NONE SEEN   Trans Epithel, UA <1    Mucous PRESENT    Hyaline Casts, UA PRESENT    US Abdomen Limited Ruq  Result Date: 12/14/2015 CLINICAL DATA:  Right upper quadrant pain EXAM: US ABDOMEN LIMITED - RIGHT UPPER QUADRANT COMPARISON:  CT 12/08/2015 FINDINGS: Gallbladder: Gallbladder is slightly dilated measuring up to 4.6 cm in diameter. Moderate sludge is present within the lumen with small echodense stones. Additional echodense foci within the wall of the gallbladder compatible with adenomyomatosis. Gallbladder wall is slightly thickened at 4 mm. Small amount of peri cholecystic fluid. Sonographer reports positive sonographic Murphy's sign. Questionable focal defect within the inferior wall of the gallbladder with surrounding sludge. Common bile duct: Diameter: Normal at 3.1 mm Liver: No focal lesion identified. Within normal limits in parenchymal echogenicity. Cysts noted on prior CT are not well demonstrated. IMPRESSION: 1. Dilated gallbladder containing moderate sludge and stones. There is wall thickening, small peri cholecystic fluid and positive sonographic Murphy's, collective findings would be suspicious for acute cholecystitis. Questionable small focal area of wall discontinuity involving the dependent portion of the gallbladder, a small perforation cannot be excluded. 2. No biliary dilatation Electronically Signed   By: Donavan Foil M.D.   On: 12/14/2015 23:18    Review of Systems  Constitutional: Positive for chills, fever, malaise/fatigue and weight loss. Negative for diaphoresis.  HENT: Negative for congestion and sore throat.   Respiratory: Negative for cough, sputum production, shortness of breath and wheezing.   Cardiovascular: Positive for palpitations. Negative for chest pain, claudication, leg swelling and PND.  Gastrointestinal: Positive for abdominal pain. Negative for  blood in stool, constipation, diarrhea, heartburn, melena, nausea and vomiting.  Genitourinary: Negative for dysuria, flank pain, frequency, hematuria and urgency.  Musculoskeletal: Negative for back pain, falls and neck pain.  Skin: Negative for itching and rash.  Neurological: Positive for weakness. Negative for dizziness and loss of consciousness.  Psychiatric/Behavioral: Negative for depression. The patient is not nervous/anxious.   All other systems reviewed and are negative.   Blood pressure 129/78, pulse 100, temperature 98.1 F (36.7  C), temperature source Oral, resp. rate 18, height 5' 5"  (1.651 m), weight 181 lb (82.1 kg), SpO2 100 %. Physical Exam  Vitals reviewed. Constitutional: She is oriented to person, place, and time. She appears well-developed and well-nourished. No distress.  HENT:  Head: Normocephalic and atraumatic.  Right Ear: External ear normal.  Left Ear: External ear normal.  Nose: Nose normal.  Mouth/Throat: Oropharynx is clear and moist. No oropharyngeal exudate.  Eyes: Conjunctivae and EOM are normal. Pupils are equal, round, and reactive to light. No scleral icterus.  Neck: Normal range of motion. Neck supple. No tracheal deviation present.  Cardiovascular: Normal heart sounds and intact distal pulses.  Exam reveals no gallop and no friction rub.   No murmur heard. Tachycardic with slightly irregular rhythm  Respiratory: Effort normal and breath sounds normal. No respiratory distress. She has no wheezes. She has no rales.  GI: Soft. Bowel sounds are normal. She exhibits no distension. There is no tenderness. There is no rebound and no guarding.  Obese, soft, non distended, well healed lower midline incision, currently non tender throughout without any murphy's sign or hepato or splenomegaly  Musculoskeletal: Normal range of motion. She exhibits no edema, tenderness or deformity.  Neurological: She is alert and oriented to person, place, and time. No cranial  nerve deficit.  Skin: Skin is warm and dry. No rash noted. No erythema. No pallor.  Psychiatric: She has a normal mood and affect. Her behavior is normal. Judgment and thought content normal.     Assessment/Plan 70 yr old female who is here with urinary tract infection, hyponatremia, hypokalemia,  hypochloremia, dehydration and with symptomatic cholelithiasis.  I aerated her past history which is positive for well-controlled hypertension and A. fib which she takes aspirin and metoprolol for her, she was taken off the Coumadin approximately one year ago. I have personally reviewed her laboratory values which are significant for a slightly elevated white blood cell count 12, decreased sodium at 127, decreased potassium at 2.9, decreased chloride at 88 and elevated creatinine at 1.63 and an elevated bilirubin at 2.4 as well as an elevated alkaline phosphatase of 215. As well as a positive UA with 3+ leukocyte esterase and bacteria.  I have also personally reviewed her ultrasound images which showed a slightly distended gallbladder but a normal gallbladder wall thickness of 4 mm without any fluid but with some stones and a common bile duct of 3.8 mm. I've also reviewed the radiology read as above. I've discussed this with both Dr. Meade Maw of the emergency room and with Dr. Jannifer Franklin of internal medicine.    The patient has symptomatic cholelithiasis and has become dehydrated with imbalance of electrolytes and now urinary tract infection as well. She will be admitted by medicine who will replete her electrolytes and we will reevaluate her bilirubin in the a.m. If her bilirubin has decreased with fluid resuscitation and is likely just elevated due to dehydration and not from the passage of a common bile duct stone. If it remains elevated then likely will need to do a MRCP to ensure that there is not a common bile duct stone, although with a common bile duct of only 20 mm in a 70 year old patient finding this to  be less likely. I did discuss with the patient and her son that if this is the case that she may need an ERCP as well. If her bilirubin does come down and her electrolytes are repleted then we should be able to remove her  gallbladder during this admission.    Hubbard Robinson, MD 12/15/2015, 12:19 AM

## 2015-12-15 NOTE — Progress Notes (Signed)
Pharmacy Antibiotic Note  Robin Allen is a 70 y.o. female admitted on 12/14/2015 with UTI.  Pharmacy has been consulted for ceftriaxone dosing.  Plan: Ceftriaxone 2 grams q 24 hours ordered.  Height: 5\' 5"  (165.1 cm) Weight: 187 lb 14.4 oz (85.2 kg) IBW/kg (Calculated) : 57  Temp (24hrs), Avg:98.5 F (36.9 C), Min:98.1 F (36.7 C), Max:99.5 F (37.5 C)   Recent Labs Lab 12/08/15 1136 12/14/15 1906  WBC 9.9 12.4*  CREATININE 1.03* 1.63*    Estimated Creatinine Clearance: 34.1 mL/min (by C-G formula based on SCr of 1.63 mg/dL (H)).    Allergies  Allergen Reactions  . Augmentin [Amoxicillin-Pot Clavulanate]     nausea    Antimicrobials this admission: ceftriaxone 12/1 >>    >>   Dose adjustments this admission:   Microbiology results:  12/1 UCx:      12/1 UA: LE(+) NO2(-) WBC TNTC   Thank you for allowing pharmacy to be a part of this patient's care.  Ravi Tuccillo S 12/15/2015 4:35 AM

## 2015-12-15 NOTE — H&P (Signed)
[facility] [place] NAME: Robin Allen    MR#:  [id number]  DATE OF BIRTH:  1945/12/03  DATE OF ADMISSION:  12/14/2015  PRIMARY CARE PHYSICIAN: Madelyn Brunner, MD   REQUESTING/REFERRING PHYSICIAN: Marcelene Butte, MD  CHIEF COMPLAINT:   Chief Complaint  Patient presents with  . Fever  . Abdominal Pain    HISTORY OF PRESENT ILLNESS:  Robin Allen  is a 70 y.o. female who presents with Abdominal pain and reported fever at home. Evaluation here in the ED she was found to have UTI. There is some question of gallbladder pathology. Ultrasound revealed very minimal thickening of the gallbladder wall, barely beyond normal. Her common bile duct was nondilated. Surgery saw the patient and did not feel that her primary pathology was gallbladder related. She did have some sludge in her gallbladder and surgery does potentially recommend elective cholecystectomy, but preferred to do so after her UTI has been treated and her electrolytes have corrected. Patient has some mild electrolyte derangements likely related to her UTI. She does have some elevated bilirubin, though it is suspected this may potentially be related to dehydration also resultant from UTI. Hospitalists were called for admission with surgical team following as consultants.  PAST MEDICAL HISTORY:   Past Medical History:  Diagnosis Date  . Atrial fibrillation ([place])   . Coronary artery disease   . Hyperlipidemia   . Hypertension     PAST SURGICAL HISTORY:   Past Surgical History:  Procedure Laterality Date  . ABDOMINAL HYSTERECTOMY  1995   Total, lower midline incision    SOCIAL HISTORY:   Social History  Substance Use Topics  . Smoking status: Former Smoker    Packs/day: 0.10    Years: 30.00  . Smokeless tobacco: Never Used  . Alcohol use No    FAMILY HISTORY:   Family History  Problem Relation Age of Onset  . Basal cell carcinoma Mother   . Hypertension Mother    . Melanoma Father   . Hypertension Father     DRUG ALLERGIES:   Allergies  Allergen Reactions  . Augmentin [Amoxicillin-Pot Clavulanate]     nausea    MEDICATIONS AT HOME:   Prior to Admission medications   Medication Sig Start Date End Date Taking? Authorizing Provider  allopurinol (ZYLOPRIM) 100 MG tablet TAKE 1 TABLET BY MOUTH ONCE DAILY 07/16/15  Yes Historical Provider, MD  aspirin EC 81 MG tablet Take 81 mg by mouth daily.   Yes Historical Provider, MD  dicyclomine (BENTYL) 20 MG tablet Take 1 tablet (20 mg total) by mouth 3 (three) times daily as needed for spasms. 12/08/15  Yes Daymon Larsen, MD  Garlic [id number] MG TABS Take by mouth.   Yes Historical Provider, MD  metoprolol (LOPRESSOR) 50 MG tablet TAKE 1 TABLET BY MOUTH TWICE DAILY. 07/05/15  Yes Historical Provider, MD  ondansetron (ZOFRAN) 4 MG tablet Take 1 tablet (4 mg total) by mouth every 8 (eight) hours as needed for nausea or vomiting. 12/08/15  Yes Daymon Larsen, MD  triamterene-hydrochlorothiazide (DYAZIDE) 37.5-25 MG capsule TAKE 1 CAPSULE BY MOUTH ONCE EVERY MORNING 11/11/15  Yes Historical Provider, MD    REVIEW OF SYSTEMS:  Review of Systems  Constitutional: Positive for fever (subjective). Negative for chills, malaise/fatigue and weight loss.  HENT: Negative for ear pain, hearing loss and tinnitus.   Eyes: Negative for blurred vision, double vision, pain and redness.  Respiratory: Negative for cough, hemoptysis and  shortness of breath.   Cardiovascular: Negative for chest pain, palpitations, orthopnea and leg swelling.  Gastrointestinal: Positive for abdominal pain. Negative for constipation, diarrhea, nausea and vomiting.  Genitourinary: Negative for dysuria, frequency and hematuria.  Musculoskeletal: Negative for back pain, joint pain and neck pain.  Skin:       No acne, rash, or lesions  Neurological: Negative for dizziness, tremors, focal weakness and weakness.  Endo/Heme/Allergies: Negative for  polydipsia. Does not bruise/bleed easily.  Psychiatric/Behavioral: Negative for depression. The patient is not nervous/anxious and does not have insomnia.      VITAL SIGNS:   Vitals:   12/14/15 1904 12/14/15 2048 12/14/15 2130 12/14/15 2302  BP:   120/76 129/78  Pulse:   76 100  Resp:    18  Temp:  98.1 F (36.7 C)  98.1 F (36.7 C)  TempSrc:    Oral  SpO2:   100% 100%  Weight: 82.1 kg (181 lb)     Height: 5\' 5"  (1.651 m)      Wt Readings from Last 3 Encounters:  12/14/15 82.1 kg (181 lb)    PHYSICAL EXAMINATION:  Physical Exam  Vitals reviewed. Constitutional: She is oriented to person, place, and time. She appears well-developed and well-nourished. No distress.  HENT:  Head: Normocephalic and atraumatic.  Dry mucous membranes  Eyes: Conjunctivae and EOM are normal. Pupils are equal, round, and reactive to light. No scleral icterus.  Neck: Normal range of motion. Neck supple. No JVD present. No thyromegaly present.  Cardiovascular: Normal rate, regular rhythm and intact distal pulses.  Exam reveals no gallop and no friction rub.   No murmur heard. Respiratory: Effort normal and breath sounds normal. No respiratory distress. She has no wheezes. She has no rales.  GI: Soft. Bowel sounds are normal. She exhibits no distension. There is tenderness.  Musculoskeletal: Normal range of motion. She exhibits no edema.  No arthritis, no gout  Lymphadenopathy:    She has no cervical adenopathy.  Neurological: She is alert and oriented to person, place, and time. No cranial nerve deficit.  No dysarthria, no aphasia  Skin: Skin is warm and dry. No rash noted. No erythema.  Psychiatric: She has a normal mood and affect. Her behavior is normal. Judgment and thought content normal.    LABORATORY PANEL:   CBC  Recent Labs Lab 12/14/15 1906  WBC 12.4*  HGB 11.9*  HCT 34.8*  PLT 274    ------------------------------------------------------------------------------------------------------------------  Chemistries   Recent Labs Lab 12/14/15 1906  NA 127*  K 2.9*  CL 88*  CO2 27  GLUCOSE 116*  BUN 22*  CREATININE 1.63*  CALCIUM 9.4  AST 57*  ALT 37  ALKPHOS 215*  BILITOT 2.4*   ------------------------------------------------------------------------------------------------------------------  Cardiac Enzymes  Recent Labs Lab 12/08/15 1136  TROPONINI <0.03   ------------------------------------------------------------------------------------------------------------------  RADIOLOGY:  US Abdomen Limited Ruq  Result Date: 12/14/2015 CLINICAL DATA:  Right upper quadrant pain EXAM: US ABDOMEN LIMITED - RIGHT UPPER QUADRANT COMPARISON:  CT 12/08/2015 FINDINGS: Gallbladder: Gallbladder is slightly dilated measuring up to 4.6 cm in diameter. Moderate sludge is present within the lumen with small echodense stones. Additional echodense foci within the wall of the gallbladder compatible with adenomyomatosis. Gallbladder wall is slightly thickened at 4 mm. Small amount of peri cholecystic fluid. Sonographer reports positive sonographic Murphy's sign. Questionable focal defect within the inferior wall of the gallbladder with surrounding sludge. Common bile duct: Diameter: Normal at 3.1 mm Liver: No focal lesion identified. Within normal limits in  parenchymal echogenicity. Cysts noted on prior CT are not well demonstrated. IMPRESSION: 1. Dilated gallbladder containing moderate sludge and stones. There is wall thickening, small peri cholecystic fluid and positive sonographic Murphy's, collective findings would be suspicious for acute cholecystitis. Questionable small focal area of wall discontinuity involving the dependent portion of the gallbladder, a small perforation cannot be excluded. 2. No biliary dilatation Electronically Signed   By: Donavan Foil M.D.   On: 12/14/2015  23:18    EKG:   Orders placed or performed during the hospital encounter of 12/08/15  . ED EKG  . ED EKG    IMPRESSION AND PLAN:  Principal Problem:   UTI (urinary tract infection) - IV antibiotics, urine culture sent Active Problems:   Symptomatic cholelithiasis - sludge seen in gallbladder, no specific stone seen on US imaging tonight.  Surgery following along, likely recommending elective cholecystectomy after treatment of UTI if bilirubin corrects, otherwise may need MRCP   AKI (acute kidney injury) ([place]) - likely due to UTI, hydrate with fluids and monitor for expected improvement, avoid nephrotoxins   Atrial fibrillation ([place]) - continue home meds   Hypertension - continue home meds   Hyperlipidemia - continue home meds  All the records are reviewed and case discussed with ED provider. Management plans discussed with the patient and/or family.  DVT PROPHYLAXIS: SubQ heparin  GI PROPHYLAXIS: None  ADMISSION STATUS: Inpatient  CODE STATUS: Full Code Status History    This patient does not have a recorded code status. Please follow your organizational policy for patients in this situation.      TOTAL TIME TAKING CARE OF THIS PATIENT: 45 minutes.    Daymion Nazaire [place] 12/15/2015, 1:06 AM  [facility]  Office  [phone]  CC: Primary care physician; Madelyn Brunner, MD

## 2015-12-15 NOTE — Progress Notes (Signed)
12/15/2015  Subjective: Patient was admitted last night with a UTI and abdominal pain concerning for possible symptomatic cholelithiasis. However her LFTs were elevated with a total bilirubin of 2.4. This morning the patient reports that she still having soreness in the epigastric and right upper quadrant regions. LFTs this morning reveal a total bilirubin of 1.9 with a direct component of 0.9. Alkaline phosphatase is still elevated at 185. Her creatinine has improved to 1.2 although her potassium is still low at 2.5 this morning. The patient denies any nausea or vomiting was able to eat some crackers last night.  Vital signs: Temp:  [98.1 F (36.7 C)-99.5 F (37.5 C)] 98.1 F (36.7 C) (12/02 0734) Pulse Rate:  [76-109] 86 (12/02 0734) Resp:  [18-178] 26 (12/02 0734) BP: (113-130)/(76-88) 120/81 (12/02 0734) SpO2:  [97 %-100 %] 97 % (12/02 0734) Weight:  [82.1 kg (181 lb)-85.2 kg (187 lb 14.4 oz)] 85.2 kg (187 lb 14.4 oz) (12/02 0301)   Intake/Output: 12/01 0701 - 12/02 0700 In: -  Out: 300 [Urine:300]    Physical Exam: Constitutional:  No acute distress Abdomen: Soft, obese, nondistended, tender to palpation in the epigastric and minimal on the right upper quadrant areas. Negative Murphy sign.  Labs:   Recent Labs  12/14/15 1906 12/15/15 0440  WBC 12.4* 11.7*  HGB 11.9* 11.4*  HCT 34.8* 33.2*  PLT 274 253    Recent Labs  12/14/15 1906 12/15/15 0440  NA 127* 130*  K 2.9* 2.5*  CL 88* 94*  CO2 27 27  GLUCOSE 116* 101*  BUN 22* 18  CREATININE 1.63* 1.20*  CALCIUM 9.4 8.9   No results for input(s): LABPROT, INR in the last 72 hours.  Imaging: US Abdomen Limited Ruq  Result Date: 12/14/2015 CLINICAL DATA:  Right upper quadrant pain EXAM: US ABDOMEN LIMITED - RIGHT UPPER QUADRANT COMPARISON:  CT 12/08/2015 FINDINGS: Gallbladder: Gallbladder is slightly dilated measuring up to 4.6 cm in diameter. Moderate sludge is present within the lumen with small echodense stones.  Additional echodense foci within the wall of the gallbladder compatible with adenomyomatosis. Gallbladder wall is slightly thickened at 4 mm. Small amount of peri cholecystic fluid. Sonographer reports positive sonographic Murphy's sign. Questionable focal defect within the inferior wall of the gallbladder with surrounding sludge. Common bile duct: Diameter: Normal at 3.1 mm Liver: No focal lesion identified. Within normal limits in parenchymal echogenicity. Cysts noted on prior CT are not well demonstrated. IMPRESSION: 1. Dilated gallbladder containing moderate sludge and stones. There is wall thickening, small peri cholecystic fluid and positive sonographic Murphy's, collective findings would be suspicious for acute cholecystitis. Questionable small focal area of wall discontinuity involving the dependent portion of the gallbladder, a small perforation cannot be excluded. 2. No biliary dilatation Electronically Signed   By: Donavan Foil M.D.   On: 12/14/2015 23:18    Assessment/Plan: 70 year old female with UTI and symptomatic cholelithiasis with elevated LFTs.  -Continue IV antibiotic treatment for her UTI. -Given that her total bilirubin is still elevated with the significant component of her direct bilirubin will order an MRCP today. She would need to be nothing by mouth for this and thus have changed her diet order temporarily. -Continue IV fluid hydration and correcting her electrolytes.   Melvyn Neth, [place]

## 2015-12-15 NOTE — Progress Notes (Signed)
Notified MD of K+ 2.5. Awaiting orders.

## 2015-12-16 ENCOUNTER — Inpatient Hospital Stay: Payer: Commercial Managed Care - HMO | Admitting: Anesthesiology

## 2015-12-16 ENCOUNTER — Encounter: Payer: Self-pay | Admitting: Anesthesiology

## 2015-12-16 ENCOUNTER — Inpatient Hospital Stay: Payer: Commercial Managed Care - HMO

## 2015-12-16 ENCOUNTER — Encounter
Admission: EM | Disposition: A | Discharge status: Home / Self Care | Payer: Self-pay | Source: Home / Self Care | Attending: Internal Medicine

## 2015-12-16 DIAGNOSIS — K810 Acute cholecystitis: Secondary | ICD-10-CM

## 2015-12-16 HISTORY — PX: CHOLECYSTECTOMY: SHX55

## 2015-12-16 LAB — BASIC METABOLIC PANEL
ANION GAP: 5 (ref 5–15)
BUN: 12 mg/dL (ref 6–20)
CHLORIDE: 102 mmol/L (ref 101–111)
CO2: 27 mmol/L (ref 22–32)
Calcium: 9.1 mg/dL (ref 8.9–10.3)
Creatinine, Ser: 1.04 mg/dL — ABNORMAL HIGH (ref 0.44–1.00)
GFR calc Af Amer: >60 mL/min (ref >60)
GFR calc non Af Amer: 53 mL/min — ABNORMAL LOW (ref >60)
Glucose, Bld: 95 mg/dL (ref 65–99)
POTASSIUM: 3.8 mmol/L (ref 3.5–5.1)
SODIUM: 134 mmol/L — AB (ref 135–145)

## 2015-12-16 LAB — CBC WITH DIFFERENTIAL/PLATELET
BASOS ABS: 0 10*3/uL (ref 0–0.1)
Basophils Relative: 0 %
EOS ABS: 0.1 10*3/uL (ref 0–0.7)
Eosinophils Relative: 1 %
HCT: 31.5 % — ABNORMAL LOW (ref 35.0–47.0)
HEMOGLOBIN: 10.9 g/dL — AB (ref 12.0–16.0)
LYMPHS ABS: 1.6 10*3/uL (ref 1.0–3.6)
LYMPHS PCT: 15 %
MCH: 30 pg (ref 26.0–34.0)
MCHC: 34.5 g/dL (ref 32.0–36.0)
MCV: 86.9 fL (ref 80.0–100.0)
Monocytes Absolute: 0.8 10*3/uL (ref 0.2–0.9)
Monocytes Relative: 7 %
NEUTROS PCT: 77 %
Neutro Abs: 8.2 10*3/uL — ABNORMAL HIGH (ref 1.4–6.5)
Platelets: 271 10*3/uL (ref 150–440)
RBC: 3.62 MIL/uL — AB (ref 3.80–5.20)
RDW: 14 % (ref 11.5–14.5)
WBC: 10.7 10*3/uL (ref 3.6–11.0)

## 2015-12-16 LAB — URINE CULTURE

## 2015-12-16 LAB — MAGNESIUM: MAGNESIUM: 1.9 mg/dL (ref 1.7–2.4)

## 2015-12-16 LAB — HEPATIC FUNCTION PANEL
ALBUMIN: 2.6 g/dL — AB (ref 3.5–5.0)
ALT: 28 U/L (ref 14–54)
AST: 41 U/L (ref 15–41)
Alkaline Phosphatase: 184 U/L — ABNORMAL HIGH (ref 38–126)
BILIRUBIN DIRECT: 0.5 mg/dL (ref 0.1–0.5)
Indirect Bilirubin: 0.5 mg/dL (ref 0.3–0.9)
TOTAL PROTEIN: 6 g/dL — AB (ref 6.5–8.1)
Total Bilirubin: 1 mg/dL (ref 0.3–1.2)

## 2015-12-16 SURGERY — LAPAROSCOPIC CHOLECYSTECTOMY WITH INTRAOPERATIVE CHOLANGIOGRAM
Anesthesia: General

## 2015-12-16 MED ORDER — LIDOCAINE HCL (CARDIAC) 20 MG/ML IV SOLN
INTRAVENOUS | Status: DC | PRN
  Administered 2015-12-16: 50 mg via INTRAVENOUS

## 2015-12-16 MED ORDER — BUPIVACAINE-EPINEPHRINE 0.25% -1:200000 IJ SOLN
INTRAMUSCULAR | Status: DC | PRN
  Administered 2015-12-16: 30 mL

## 2015-12-16 MED ORDER — LACTATED RINGERS IV SOLN
INTRAVENOUS | Status: DC | PRN
  Administered 2015-12-16 (×2): via INTRAVENOUS

## 2015-12-16 MED ORDER — ROCURONIUM BROMIDE 100 MG/10ML IV SOLN
INTRAVENOUS | Status: DC | PRN
  Administered 2015-12-16 (×2): 10 mg via INTRAVENOUS
  Administered 2015-12-16: 30 mg via INTRAVENOUS

## 2015-12-16 MED ORDER — FENTANYL CITRATE (PF) 100 MCG/2ML IJ SOLN
INTRAMUSCULAR | Status: DC | PRN
  Administered 2015-12-16: 50 ug via INTRAVENOUS
  Administered 2015-12-16: 100 ug via INTRAVENOUS
  Administered 2015-12-16 (×2): 25 ug via INTRAVENOUS

## 2015-12-16 MED ORDER — HYDROMORPHONE HCL 1 MG/ML IJ SOLN
0.5000 mg | INTRAMUSCULAR | Status: DC | PRN
  Administered 2015-12-16 – 2015-12-17 (×3): 0.5 mg via INTRAVENOUS
  Filled 2015-12-16 (×4): qty 1

## 2015-12-16 MED ORDER — OXYCODONE HCL 5 MG PO TABS
5.0000 mg | ORAL_TABLET | ORAL | Status: DC | PRN
Start: 2015-12-16 — End: 2015-12-18
  Administered 2015-12-16 – 2015-12-17 (×3): 10 mg via ORAL
  Administered 2015-12-17: 5 mg via ORAL
  Administered 2015-12-17 (×2): 10 mg via ORAL
  Administered 2015-12-18 (×3): 5 mg via ORAL
  Filled 2015-12-16 (×4): qty 1
  Filled 2015-12-16 (×5): qty 2

## 2015-12-16 MED ORDER — FENTANYL CITRATE (PF) 100 MCG/2ML IJ SOLN
INTRAMUSCULAR | Status: AC
  Filled 2015-12-16: qty 2

## 2015-12-16 MED ORDER — PROPOFOL 10 MG/ML IV BOLUS
INTRAVENOUS | Status: DC | PRN
  Administered 2015-12-16: 140 mg via INTRAVENOUS

## 2015-12-16 MED ORDER — SUCCINYLCHOLINE CHLORIDE 20 MG/ML IJ SOLN
INTRAMUSCULAR | Status: DC | PRN
  Administered 2015-12-16: 100 mg via INTRAVENOUS

## 2015-12-16 MED ORDER — ONDANSETRON HCL 4 MG/2ML IJ SOLN: 4.0000 mg | Freq: Once | INTRAMUSCULAR | Status: DC | PRN

## 2015-12-16 MED ORDER — ACETAMINOPHEN 500 MG PO TABS
1000.0000 mg | ORAL_TABLET | Freq: Four times a day (QID) | ORAL | Status: DC
  Administered 2015-12-16 – 2015-12-18 (×6): 1000 mg via ORAL
  Filled 2015-12-16 (×10): qty 2

## 2015-12-16 MED ORDER — SODIUM CHLORIDE 0.9 % IV SOLN
INTRAVENOUS | Status: DC | PRN
  Administered 2015-12-16: 15 mL

## 2015-12-16 MED ORDER — FENTANYL CITRATE (PF) 100 MCG/2ML IJ SOLN
25.0000 ug | INTRAMUSCULAR | Status: DC | PRN
  Administered 2015-12-16 (×4): 25 ug via INTRAVENOUS

## 2015-12-16 MED ORDER — SUGAMMADEX SODIUM 200 MG/2ML IV SOLN
INTRAVENOUS | Status: DC | PRN
  Administered 2015-12-16: 200 mg via INTRAVENOUS

## 2015-12-16 MED ORDER — ONDANSETRON HCL 4 MG/2ML IJ SOLN
INTRAMUSCULAR | Status: DC | PRN
  Administered 2015-12-16: 4 mg via INTRAVENOUS

## 2015-12-16 MED ORDER — BUPIVACAINE-EPINEPHRINE (PF) 0.25% -1:200000 IJ SOLN
INTRAMUSCULAR | Status: AC
Start: 2015-12-16 — End: 2015-12-16
  Filled 2015-12-16: qty 30

## 2015-12-16 SURGICAL SUPPLY — 49 items
APPLIER CLIP 5 13 M/L LIGAMAX5 (MISCELLANEOUS) ×3
BLADE SURG 15 STRL LF DISP TIS (BLADE) ×1 IMPLANT
BLADE SURG 15 STRL SS (BLADE) ×2
CANISTER SUCT 1200ML W/VALVE (MISCELLANEOUS) IMPLANT
CANISTER SUCT 3000ML PPV (MISCELLANEOUS) ×3 IMPLANT
CATH CHOLANGI 4FR 420404F (CATHETERS) ×3 IMPLANT
CHLORAPREP W/TINT 26ML (MISCELLANEOUS) ×3 IMPLANT
CLIP APPLIE 5 13 M/L LIGAMAX5 (MISCELLANEOUS) ×1 IMPLANT
CONRAY 60ML FOR OR (MISCELLANEOUS) ×3 IMPLANT
DERMABOND ADVANCED (GAUZE/BANDAGES/DRESSINGS) ×2
DERMABOND ADVANCED .7 DNX12 (GAUZE/BANDAGES/DRESSINGS) ×1 IMPLANT
DRAPE C-ARM XRAY 36X54 (DRAPES) ×3 IMPLANT
ELECT REM PT RETURN 9FT ADLT (ELECTROSURGICAL) ×3
ELECTRODE REM PT RTRN 9FT ADLT (ELECTROSURGICAL) ×1 IMPLANT
ENDOPOUCH RETRIEVER 10 (MISCELLANEOUS) ×3 IMPLANT
GAUZE SPONGE 4X4 12PLY STRL (GAUZE/BANDAGES/DRESSINGS) ×3 IMPLANT
GLOVE SURG SYN 7.0 (GLOVE) ×3 IMPLANT
GLOVE SURG SYN 7.5  E (GLOVE) ×6
GLOVE SURG SYN 7.5 E (GLOVE) ×3 IMPLANT
GOWN STRL REUS W/ TWL LRG LVL3 (GOWN DISPOSABLE) ×2 IMPLANT
GOWN STRL REUS W/TWL LRG LVL3 (GOWN DISPOSABLE) ×4
HEMOSTAT SURGICEL 2X3 (HEMOSTASIS) ×3 IMPLANT
IRRIGATION STRYKERFLOW (MISCELLANEOUS) ×1 IMPLANT
IRRIGATOR STRYKERFLOW (MISCELLANEOUS) ×3
IV CATH ANGIO 12GX3 LT BLUE (NEEDLE) ×3 IMPLANT
IV NS 1000ML (IV SOLUTION) ×4
IV NS 1000ML BAXH (IV SOLUTION) ×2 IMPLANT
JACKSON PRATT 10 (INSTRUMENTS) ×3 IMPLANT
L-HOOK LAP DISP 36CM (ELECTROSURGICAL) ×3
LABEL OR SOLS (LABEL) ×3 IMPLANT
LHOOK LAP DISP 36CM (ELECTROSURGICAL) ×1 IMPLANT
NDL HPO THNWL 1X22GA REG BVL (NEEDLE) ×1 IMPLANT
NDL SAFETY 22GX1.5 (NEEDLE) ×3 IMPLANT
NEEDLE SAFETY 22GX1 (NEEDLE) ×2
PACK LAP CHOLECYSTECTOMY (MISCELLANEOUS) ×3 IMPLANT
PENCIL ELECTRO HAND CTR (MISCELLANEOUS) ×3 IMPLANT
POUCH ENDO CATCH 10MM SPEC (MISCELLANEOUS) ×3 IMPLANT
SCISSORS METZENBAUM CVD 33 (INSTRUMENTS) ×3 IMPLANT
SLEEVE ADV FIXATION 5X100MM (TROCAR) ×9 IMPLANT
SPONGE VERSALON 4X4 4PLY (MISCELLANEOUS) ×3 IMPLANT
SUT ETHILON 3-0 FS-10 30 BLK (SUTURE) ×3
SUT MNCRL 4-0 (SUTURE) ×2
SUT MNCRL 4-0 27XMFL (SUTURE) ×1
SUT VICRYL 0 AB UR-6 (SUTURE) ×3 IMPLANT
SUTURE EHLN 3-0 FS-10 30 BLK (SUTURE) ×1 IMPLANT
SUTURE MNCRL 4-0 27XMF (SUTURE) ×1 IMPLANT
TROCAR 130MM GELPORT  DAV (MISCELLANEOUS) ×3 IMPLANT
TROCAR Z-THREAD OPTICAL 5X100M (TROCAR) ×9 IMPLANT
TUBING INSUFFLATOR HI FLOW (MISCELLANEOUS) ×3 IMPLANT

## 2015-12-16 NOTE — OR Nursing (Signed)
We originally had 4 operative ports.towards the end of the case Dr. Hampton Abbot decide to do a cholangiogram and made a 5th incision. I could not figure out how to add the 5th port.

## 2015-12-16 NOTE — Op Note (Signed)
Procedure Date:  12/16/2015  Pre-operative Diagnosis:  Acute cholecystitis  Post-operative Diagnosis: Gangrenous cholecystitis with abscess  Procedure:  Laparoscopic cholecystectomy with intraoperative cholangiogram  Surgeon:  Melvyn Neth, MD  Anesthesia:  General endotracheal  Estimated Blood Loss:  75 ml  Specimens:  gallbladder  Complications:  none  Indications for Procedure:  This is a 70 y.o. female who presents with abdominal pain and workup revealing acute cholecystitis.  MRCP revealed no choledocholithiasis.  The benefits, complications, treatment options, and expected outcomes were discussed with the patient. The risks of bleeding, infection, recurrence of symptoms, failure to resolve symptoms, bile duct damage, bile duct leak, retained common bile duct stone, bowel injury, and need for further procedures were all discussed with the patient and she was willing to proceed.  Description of Procedure: The patient was correctly identified in the preoperative area and brought into the operating room.  The patient was placed supine with VTE prophylaxis in place.  Appropriate time-outs were performed.  Anesthesia was induced and the patient was intubated.  Appropriate antibiotics were infused.  The abdomen was prepped and draped in a sterile fashion. An infraumbilical incision was made. A cutdown technique was used to enter the abdominal cavity without injury, and a Hasson trocar was inserted.  Pneumoperitoneum was obtained with appropriate opening pressures.  A 5-mm port was placed in the subxiphoid area and two 5-mm ports were placed in the right upper quadrant under direct visualization.  The gallbladder was identified and found to be distended and acutely inflamed, requiring needle decompression.  The fundus was grasped and retracted cephalad.  The gallbladder had significant adhesions to the omentum and colon.  These adhesions were lysed bluntly and with electrocautery. With  the traction used for the dissection, the anterior wall of the gallbladder completely fell apart and revealed a pocket of purulent fluid which was drained.  The wall of the gallbladder was necrotic in this portion.  It was placed in an Endocatch bag and taken out through the umbilical port. The infundibulum was grasped and retracted laterally, exposing the peritoneum overlying the gallbladder.  Adhesions to the lateral and medial walls were also dissected.  The peritoneum was incised with electrocautery and extended on either side of the gallbladder.  After thorough and careful dissection, the cystic duct and cystic artery were clearly identified.  The cystic duct was clipped once distally and a ductotomy was created.  A 14Fr angiocath was placed in the right upper quadrant and the cholangiogram catheter passed through it.  Using Maryland forceps, the catheter was introduced into the ductotomy and secured with a clip.  The C-arm was then brought into the field and a cholangiogram was performed showing an intact biliary tree without any filling defects.  The right and left hepatic ducts were clearly seen, the cystic duct was tortuous, and the common bile duct tapered appropriately going into the duodenum.  After completion of the cholangiogram, the catheter was removed and the cystic duct was clipped twice proximally and cut in between.  The cystic artery was clipped and cut in same fashion.  The gallbladder was taken from the gallbladder fossa in a retrograde fashion with electrocautery. The gallbladder was placed in an Endocatch bag and brought out via the umbilical incision. The liver bed was inspected and any bleeding was controlled with electrocautery.  The area was thoroughly irrigated with 1.5 L of saline.  A small piece of Surgicel was placed over the liver bed to assist with hemostasis.  A  JP drain was placed in the right upper quadrant and gallbladder fossa via the right lateral port.  The right upper  quadrant was then inspected again revealing intact clips, no bleeding, and no ductal injury.    The 5 mm ports were removed under direct visualization and the Hasson trocar was removed.  The fascial opening was closed using 0 vicryl suture.  Local anesthetic was infused in all incisions and the incisions were closed with 4-0 Monocryl.  The JP drain was secured with 3-0 Nylon. The wounds were cleaned and sealed with DermaBond.  The patient was emerged from anesthesia and extubated and brought to the recovery room for further management.  The patient tolerated the procedure well and all counts were correct at the end of the case.   Melvyn Neth, MD

## 2015-12-16 NOTE — Anesthesia Procedure Notes (Signed)
Procedure Name: Intubation Date/Time: 12/16/2015 10:36 AM Performed by: [id number], Jamario Colina Pre-anesthesia Checklist: Patient identified, Timeout performed, Suction available, Emergency Drugs available and Patient being monitored Patient Re-evaluated:Patient Re-evaluated prior to inductionOxygen Delivery Method: Circle system utilized Preoxygenation: Pre-oxygenation with 100% oxygen Intubation Type: IV induction and Rapid sequence Ventilation: Oral airway inserted - appropriate to patient size Laryngoscope Size: Mac and 3 Grade View: Grade II Tube type: Oral Tube size: 7.0 mm Number of attempts: 1 Placement Confirmation: ETT inserted through vocal cords under direct vision,  positive ETCO2,  CO2 detector and breath sounds checked- equal and bilateral Secured at: 22 cm Tube secured with: Tape

## 2015-12-16 NOTE — Progress Notes (Signed)
[facility] [place] NAME: Robin Allen    MRN#:  [id number]  DATE OF BIRTH:  13-Nov-1945  SUBJECTIVE:  Hospital Day: 1 day Robin Allen is a 70 y.o. female presenting with Fever and Abdominal Pain .   Overnight events: no acute overnight events  Interval Events: no complaints   REVIEW OF SYSTEMS:  CONSTITUTIONAL: No fever, fatigue or weakness.  EYES: No blurred or double vision.  EARS, NOSE, AND THROAT: No tinnitus or ear pain.  RESPIRATORY: No cough, shortness of breath, wheezing or hemoptysis.  CARDIOVASCULAR: No chest pain, orthopnea, edema.  GASTROINTESTINAL: No nausea, vomiting, diarrhea or abdominal pain.  GENITOURINARY: No dysuria, hematuria.  ENDOCRINE: No polyuria, nocturia,  HEMATOLOGY: No anemia, easy bruising or bleeding SKIN: No rash or lesion. MUSCULOSKELETAL: No joint pain or arthritis.   NEUROLOGIC: No tingling, numbness, weakness.  PSYCHIATRY: No anxiety or depression.   DRUG ALLERGIES:   Allergies  Allergen Reactions  . Augmentin [Amoxicillin-Pot Clavulanate]     nausea    VITALS:  Blood pressure 126/73, pulse 76, temperature 98.2 F (36.8 C), temperature source Oral, resp. rate 16, height 5\' 5"  (1.651 m), weight 85.2 kg (187 lb 14.4 oz), SpO2 100 %.  PHYSICAL EXAMINATION:  VITAL SIGNS: Vitals:   12/16/15 0356 12/16/15 0735  BP: 125/73 126/73  Pulse: (!) 107 76  Resp: 18 16  Temp: 99.4 F (37.4 C) 98.2 F (36.8 C)   GENERAL:70 y.o.female currently in no acute distress.  HEAD: Normocephalic, atraumatic.  EYES: Pupils equal, round, reactive to light. Extraocular muscles intact. No scleral icterus.  MOUTH: Moist mucosal membrane. Dentition intact. No abscess noted.  EAR, NOSE, THROAT: Clear without exudates. No external lesions.  NECK: Supple. No thyromegaly. No nodules. No JVD.  PULMONARY: Clear to ascultation, without wheeze rails or rhonci. No use of accessory muscles, Good respiratory  effort. good air entry bilaterally CHEST: Nontender to palpation.  CARDIOVASCULAR: S1 and S2. Regular rate and rhythm. No murmurs, rubs, or gallops. No edema. Pedal pulses 2+ bilaterally.  GASTROINTESTINAL: Soft, some RUQ tenderness, nondistended. No masses. Positive bowel sounds. No hepatosplenomegaly.  MUSCULOSKELETAL: No swelling, clubbing, or edema. Range of motion full in all extremities.  NEUROLOGIC: Cranial nerves II through XII are intact. No gross focal neurological deficits. Sensation intact. Reflexes intact.  SKIN: No ulceration, lesions, rashes, or cyanosis. Skin warm and dry. Turgor intact.  PSYCHIATRIC: Mood, affect within normal limits. The patient is awake, alert and oriented x 3. Insight, judgment intact.      LABORATORY PANEL:   CBC  Recent Labs Lab 12/16/15 0742  WBC 10.7  HGB 10.9*  HCT 31.5*  PLT 271   ------------------------------------------------------------------------------------------------------------------  Chemistries   Recent Labs Lab 12/16/15 0742  NA 134*  K 3.8  CL 102  CO2 27  GLUCOSE 95  BUN 12  CREATININE 1.04*  CALCIUM 9.1  MG 1.9  AST 41  ALT 28  ALKPHOS 184*  BILITOT 1.0   ------------------------------------------------------------------------------------------------------------------  Cardiac Enzymes No results for input(s): TROPONINI in the last 168 hours. ------------------------------------------------------------------------------------------------------------------  RADIOLOGY:  Mr 3d Recon At Scanner  Result Date: 12/15/2015 CLINICAL DATA:  70 year old female inpatient with elevated liver function tests. EXAM: MRI ABDOMEN WITHOUT AND WITH CONTRAST (INCLUDING MRCP) TECHNIQUE: Multiplanar multisequence MR imaging of the abdomen was performed both before and after the administration of intravenous contrast. Heavily T2-weighted images of the biliary and pancreatic ducts were obtained, and three-dimensional MRCP images  were rendered by post processing.  CONTRAST:  60mL MULTIHANCE GADOBENATE DIMEGLUMINE 529 MG/ML IV SOLN COMPARISON:  12/14/2015 right upper quadrant abdominal sonogram. 12/08/2015 CT abdomen/pelvis. FINDINGS: Partially motion degraded study. Lower chest: Clear lung bases. Hepatobiliary: Normal liver size and configuration. No hepatic steatosis. 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 No additional liver lesions. The gallbladder is distended (gallbladder diameter 4.4 cm). There is a 7 mm gallstone lodged in the gallbladder neck. There is layering sludge in the gallbladder. There is mild-to-moderate diffuse gallbladder wall thickening and hyperenhancement. There is mild stranding of the pericholecystic fat. There is a small 2.9 x 1.6 x 2.4 cm focal outpouching of the posterior gallbladder neck wall (series 2/ image 20), suspicious for a contained perforation. No intrahepatic biliary ductal dilatation. Common bile duct diameter 6 mm. No choledocholithiasis. No biliary strictures or masses. No ampullary mass. Pancreas: No pancreatic mass or duct dilation.  No pancreas divisum. Spleen: Normal size. No mass. Adrenals/Urinary Tract: Normal adrenals. No hydronephrosis. Normal kidneys with no renal mass. Stomach/Bowel: Grossly normal stomach. Visualized small and large bowel is normal caliber, with no bowel wall thickening. Vascular/Lymphatic: Normal caliber abdominal aorta. Patent portal, splenic, hepatic and renal veins. No pathologically enlarged lymph nodes in the abdomen. Other: No abdominal ascites or focal fluid collection. Musculoskeletal: No aggressive appearing focal osseous lesions. IMPRESSION: 1. Gallstone lodged in the gallbladder  neck with gallbladder distention, layering sludge, diffuse gallbladder wall thickening and wall hyperenhancement and mild stranding of the pericholecystic fat. These findings are suggestive of acute cholecystitis. 2. Focal outpouching of the posterior gallbladder neck wall, suspicious for a contained perforation. 3. No biliary ductal dilatation. Common bile duct diameter 6 mm. No choledocholithiasis. These results were called by telephone at the time of interpretation on 12/15/2015 at 6:27 pm to Dr. Olean Ree , who verbally acknowledged these results. Electronically Signed   By: Ilona Sorrel M.D.   On: 12/15/2015 18:27   Mr Abdomen Mrcp Moise Boring Contast  Result Date: 12/15/2015 CLINICAL DATA:  70 year old female inpatient with elevated liver function tests. EXAM: MRI ABDOMEN WITHOUT AND WITH CONTRAST (INCLUDING MRCP) TECHNIQUE: Multiplanar multisequence MR imaging of the abdomen was performed both before and after the administration of intravenous contrast. Heavily T2-weighted images of the biliary and pancreatic ducts were obtained, and three-dimensional MRCP images were rendered by post processing. CONTRAST:  37mL MULTIHANCE GADOBENATE DIMEGLUMINE 529 MG/ML IV SOLN COMPARISON:  12/14/2015 right upper quadrant abdominal sonogram. 12/08/2015 CT abdomen/pelvis. FINDINGS: Partially motion degraded study. Lower chest: Clear lung bases. Hepatobiliary: Normal liver size and configuration. No hepatic steatosis. 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 No additional liver lesions. The gallbladder is distended (gallbladder diameter 4.4 cm). There is a 7 mm gallstone lodged in the gallbladder neck. There is layering sludge in the gallbladder.  There is mild-to-moderate diffuse gallbladder wall thickening and hyperenhancement. There is mild stranding of the pericholecystic fat. There is a small 2.9 x 1.6 x 2.4 cm focal outpouching of the posterior gallbladder neck wall (series 2/ image 20), suspicious for a contained perforation. No intrahepatic biliary  ductal dilatation. Common bile duct diameter 6 mm. No choledocholithiasis. No biliary strictures or masses. No ampullary mass. Pancreas: No pancreatic mass or duct dilation.  No pancreas divisum. Spleen: Normal size. No mass. Adrenals/Urinary Tract: Normal adrenals. No hydronephrosis. Normal kidneys with no renal mass. Stomach/Bowel: Grossly normal stomach. Visualized small and large bowel is normal caliber, with no bowel wall thickening. Vascular/Lymphatic: Normal caliber abdominal aorta. Patent portal, splenic, hepatic and renal veins. No pathologically enlarged lymph nodes in the abdomen. Other: No abdominal ascites or focal fluid collection. Musculoskeletal: No aggressive appearing focal osseous lesions. IMPRESSION: 1. Gallstone lodged in the gallbladder neck with gallbladder distention, layering sludge, diffuse gallbladder wall thickening and wall hyperenhancement and mild stranding of the pericholecystic fat. These findings are suggestive of acute cholecystitis. 2. Focal outpouching of the posterior gallbladder neck wall, suspicious for a contained perforation. 3. No biliary ductal dilatation. Common bile duct diameter 6 mm. No choledocholithiasis. These results were called by telephone at the time of interpretation on 12/15/2015 at 6:27 pm to Dr. Olean Ree , who verbally acknowledged these results. Electronically Signed   By: Ilona Sorrel M.D.   On: 12/15/2015 18:27   US Abdomen Limited Ruq  Result Date: 12/14/2015 CLINICAL DATA:  Right upper quadrant pain EXAM: US ABDOMEN LIMITED - RIGHT UPPER QUADRANT COMPARISON:  CT 12/08/2015 FINDINGS: Gallbladder: Gallbladder is slightly dilated measuring  up to 4.6 cm in diameter. Moderate sludge is present within the lumen with small echodense stones. Additional echodense foci within the wall of the gallbladder compatible with adenomyomatosis. Gallbladder wall is slightly thickened at 4 mm. Small amount of peri cholecystic fluid. Sonographer reports positive sonographic Murphy's sign. Questionable focal defect within the inferior wall of the gallbladder with surrounding sludge. Common bile duct: Diameter: Normal at 3.1 mm Liver: No focal lesion identified. Within normal limits in parenchymal echogenicity. Cysts noted on prior CT are not well demonstrated. IMPRESSION: 1. Dilated gallbladder containing moderate sludge and stones. There is wall thickening, small peri cholecystic fluid and positive sonographic Murphy's, collective findings would be suspicious for acute cholecystitis. Questionable small focal area of wall discontinuity involving the dependent portion of the gallbladder, a small perforation cannot be excluded. 2. No biliary dilatation Electronically Signed   By: Donavan Foil M.D.   On: 12/14/2015 23:18    EKG:   Orders placed or performed during the hospital encounter of 12/08/15  . ED EKG  . ED EKG    ASSESSMENT AND PLAN:   Deshun Lorman is a 70 y.o. female presenting with Fever and Abdominal Pain . Admitted 12/14/2015 : Day #: 1 day   1. UTI: continue current antibiotics, follow culture data 2. Acute cholecystitis with possible perforation" surgery today 3. Hypokalemia: check mag replace K goal 4-5 4. Hypertension: hold htcz continue lopressor   All the records are reviewed and case discussed with Care Management/Social Workerr. Management plans discussed with the patient, family and they are in agreement.  CODE STATUS: full TOTAL TIME TAKING CARE OF THIS PATIENT: 33 minutes.   POSSIBLE D/C IN 2-3DAYS, DEPENDING ON CLINICAL CONDITION.   Emmalou Hunger,  Karenann Cai.D on 12/16/2015 at 12:35 PM  Between 7am to 6pm - Pager -  [phone]  After 6pm: House Pager: - [facility]  Office  [phone]  CC: Primary care physician; Madelyn Brunner, MD

## 2015-12-16 NOTE — Progress Notes (Signed)
12/16/2015  Subjective: No acute events overnight. Patient reports that her pain has improved. Denies any nausea. Her LFTs have improved this morning. Her white blood cell count is normalizing. Her potassium is back to normal and her creatinine has improved as well.  Vital signs: Temp:  [97.9 F (36.6 C)-99.8 F (37.7 C)] 98.2 F (36.8 C) (12/03 0735) Pulse Rate:  [76-107] 76 (12/03 0735) Resp:  [16-18] 16 (12/03 0735) BP: (111-126)/(60-73) 126/73 (12/03 0735) SpO2:  [97 %-100 %] 100 % (12/03 0735)   Intake/Output: 12/02 0701 - 12/03 0700 In: 895 [I.V.:895] Out: 2400 [Urine:2400] Last BM Date: 12/15/15  Physical Exam: Constitutional: No acute distress Abdomen:  Soft, obese, nondistended, mild tender to palpation over the right upper quadrant.  Labs:   Recent Labs  12/15/15 0440 12/16/15 0742  WBC 11.7* 10.7  HGB 11.4* 10.9*  HCT 33.2* 31.5*  PLT 253 271    Recent Labs  12/15/15 0440 12/16/15 0742  NA 130* 134*  K 2.5* 3.8  CL 94* 102  CO2 27 27  GLUCOSE 101* 95  BUN 18 12  CREATININE 1.20* 1.04*  CALCIUM 8.9 9.1   No results for input(s): LABPROT, INR in the last 72 hours.  Imaging: Mr 3d Recon At Scanner  Result Date: 12/15/2015 CLINICAL DATA:  70 year old female inpatient with elevated liver function tests. EXAM: MRI ABDOMEN WITHOUT AND WITH CONTRAST (INCLUDING MRCP) TECHNIQUE: Multiplanar multisequence MR imaging of the abdomen was performed both before and after the administration of intravenous contrast. Heavily T2-weighted images of the biliary and pancreatic ducts were obtained, and three-dimensional MRCP images were rendered by post processing. CONTRAST:  81mL MULTIHANCE GADOBENATE DIMEGLUMINE 529 MG/ML IV SOLN COMPARISON:  12/14/2015 right upper quadrant abdominal sonogram. 12/08/2015 CT abdomen/pelvis. FINDINGS: Partially motion degraded study. Lower chest: Clear lung bases. Hepatobiliary: Normal liver size and configuration. No hepatic steatosis. 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 No additional liver lesions. The gallbladder is distended (gallbladder diameter 4.4 cm). There is a 7 mm gallstone lodged in the gallbladder neck. There is layering sludge in the gallbladder. There is mild-to-moderate diffuse gallbladder wall thickening and hyperenhancement. There is mild stranding of the pericholecystic fat. There is a small 2.9 x 1.6 x 2.4 cm focal outpouching of the posterior gallbladder neck wall (series 2/ image 20), suspicious for a contained perforation. No intrahepatic biliary ductal dilatation. Common bile duct diameter 6 mm. No choledocholithiasis. No biliary strictures or masses. No ampullary mass. Pancreas: No pancreatic mass or duct dilation.  No pancreas divisum. Spleen: Normal size. No mass. Adrenals/Urinary Tract: Normal adrenals. No hydronephrosis. Normal kidneys with no renal mass. Stomach/Bowel: Grossly normal stomach. Visualized small and large bowel is normal caliber, with no bowel wall thickening. Vascular/Lymphatic: Normal caliber abdominal aorta. Patent portal, splenic, hepatic and renal veins. No pathologically enlarged lymph nodes in the abdomen. Other: No abdominal ascites or focal fluid collection. Musculoskeletal: No aggressive appearing focal osseous lesions. IMPRESSION: 1. Gallstone lodged in the gallbladder neck with gallbladder distention, layering sludge, diffuse gallbladder wall thickening and wall hyperenhancement and mild stranding of the pericholecystic fat. These findings are suggestive of acute cholecystitis. 2. Focal outpouching of the posterior gallbladder neck wall, suspicious for a contained perforation. 3. No biliary ductal dilatation.  Common bile duct diameter  6 mm. No choledocholithiasis. These results were called by telephone at the time of interpretation on 12/15/2015 at 6:27 pm to Dr. Olean Ree , who verbally acknowledged these results. Electronically Signed   By: Ilona Sorrel M.D.   On: 12/15/2015 18:27   Mr Abdomen Mrcp Moise Boring Contast  Result Date: 12/15/2015 CLINICAL DATA:  70 year old female inpatient with elevated liver function tests. EXAM: MRI ABDOMEN WITHOUT AND WITH CONTRAST (INCLUDING MRCP) TECHNIQUE: Multiplanar multisequence MR imaging of the abdomen was performed both before and after the administration of intravenous contrast. Heavily T2-weighted images of the biliary and pancreatic ducts were obtained, and three-dimensional MRCP images were rendered by post processing. CONTRAST:  43mL MULTIHANCE GADOBENATE DIMEGLUMINE 529 MG/ML IV SOLN COMPARISON:  12/14/2015 right upper quadrant abdominal sonogram. 12/08/2015 CT abdomen/pelvis. FINDINGS: Partially motion degraded study. Lower chest: Clear lung bases. Hepatobiliary: Normal liver size and configuration. No hepatic steatosis. 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 No additional liver lesions. The gallbladder is distended (gallbladder diameter 4.4 cm). There is a 7 mm gallstone lodged in the gallbladder neck. There is layering sludge in the gallbladder. There is mild-to-moderate diffuse gallbladder wall thickening and hyperenhancement. There is mild stranding of the pericholecystic fat. There is a small 2.9 x 1.6 x 2.4 cm focal outpouching of the posterior gallbladder neck wall (series 2/ image 20), suspicious for a contained perforation. No intrahepatic biliary ductal dilatation. Common bile  duct diameter 6 mm. No choledocholithiasis. No biliary strictures or masses. No ampullary mass. Pancreas: No pancreatic mass or duct dilation.  No pancreas divisum. Spleen: Normal size. No mass. Adrenals/Urinary Tract: Normal adrenals. No hydronephrosis. Normal kidneys with no renal mass. Stomach/Bowel: Grossly normal stomach. Visualized small and large bowel is normal caliber, with no bowel wall thickening. Vascular/Lymphatic: Normal caliber abdominal aorta. Patent portal, splenic, hepatic and renal veins. No pathologically enlarged lymph nodes in the abdomen. Other: No abdominal ascites or focal fluid collection. Musculoskeletal: No aggressive appearing focal osseous lesions. IMPRESSION: 1. Gallstone lodged in the gallbladder neck with gallbladder distention, layering sludge, diffuse gallbladder wall thickening and wall hyperenhancement and mild stranding of the pericholecystic fat. These findings are suggestive of acute cholecystitis. 2. Focal outpouching of the posterior gallbladder neck wall, suspicious for a contained perforation. 3. No biliary ductal dilatation. Common bile duct diameter 6 mm. No choledocholithiasis. These results were called by telephone at the time of interpretation on 12/15/2015 at 6:27 pm to Dr. Olean Ree , who verbally acknowledged these results. Electronically Signed   By: Ilona Sorrel M.D.   On: 12/15/2015 18:27    Assessment/Plan: 70 year old female with acute cholecystitis.  -MRCP done yesterday reveals no choledocholithiasis. There is suspicion for possible contained gallbladder perforation. -Patient will be taken to the operating room today for laparoscopic cholecystectomy. The patient does understand the risks and benefits of the procedure. The patient also does understand the possibility for an open procedure and she is willing to proceed.   Melvyn Neth, [place]

## 2015-12-16 NOTE — Progress Notes (Signed)
Initial Nutrition Assessment  DOCUMENTATION CODES:   Not applicable  INTERVENTION:  1. Monitor for diet advancement per MD/NP/PA - provide ONS, Snacks as needed  NUTRITION DIAGNOSIS:   Inadequate oral intake related to poor appetite, nausea, vomiting as evidenced by per patient/family report.  GOAL:   Patient will meet greater than or equal to 90% of their needs  MONITOR:   I & O's, Labs, Weight trends, Diet advancement, PO intake  REASON FOR ASSESSMENT:   Malnutrition Screening Tool    ASSESSMENT:   Robin Allen  is a 70 y.o. female who presents with Abdominal pain and reported fever at home. Evaluation here in the ED she was found to have UTI.  Spoke with Robin Allen's daughter at bedside. Pt was in PACU during visit.  She reports patient was eating well, basically eating whatever she wanted until ~1 week ago with onset of nausea/vomiting.   States she is normally a soup and vegetable meal kind of person but she ocassionally eats heavier meals.  Avoids red meat related to her a-fib.  She reports an 8-10# wt loss during that time span, but per chart review pt's weight has been stable.  Unable to complete Nutrition-Focused physical exam at this time.   Will follow up for nutrition needs now that she is s/p cholecystectomy. Labs and medications reviewed.  Diet Order:  Diet NPO time specified  Skin:  Reviewed, no issues  Last BM:  12/15/2015  Height:   Ht Readings from Last 1 Encounters:  12/14/15 5\' 5"  (1.651 m)    Weight:   Wt Readings from Last 1 Encounters:  12/15/15 187 lb 14.4 oz (85.2 kg)    Ideal Body Weight:  56.81 kg  BMI:  Body mass index is 31.27 kg/m.  Estimated Nutritional Needs:   Kcal:  1600-2000 calories  Protein:  85-102 gm  Fluid:  >/= 1.6L  EDUCATION NEEDS:   No education needs identified at this time  Robin Anis. Nakoma Gotwalt, MS, RD LDN Inpatient Clinical Dietitian Pager [phone]

## 2015-12-16 NOTE — Transfer of Care (Signed)
Immediate Anesthesia Transfer of Care Note  Patient: Robin Allen  Procedure(s) Performed: Procedure(s): LAPAROSCOPIC CHOLECYSTECTOMY  with cholangiogram (N/A)  Patient Location: PACU  Anesthesia Type:General  Level of Consciousness: awake  Airway & Oxygen Therapy: Patient Spontanous Breathing and Patient connected to face mask oxygen  Post-op Assessment: Report given to RN  Post vital signs: Reviewed and stable  Last Vitals:  Vitals:   12/16/15 0735 12/16/15 1317  BP: 126/73 108/68  Pulse: 76 90  Resp: 16 12  Temp: 36.8 C 36.4 C    Last Pain:  Vitals:   12/16/15 0735  TempSrc: Oral  PainSc:          Complications: No apparent anesthesia complications

## 2015-12-16 NOTE — Anesthesia Preprocedure Evaluation (Addendum)
Anesthesia Evaluation  Patient identified by MRN, date of birth, ID band Patient awake    Reviewed: Allergy & Precautions, NPO status , Patient's Chart, lab work & pertinent test results, reviewed documented beta blocker date and time   Airway Mallampati: III  TM Distance: >3 FB     Dental  (+) Chipped, Caps   Pulmonary former smoker,    Pulmonary exam normal        Cardiovascular hypertension, Pt. on medications and Pt. on home beta blockers + CAD  Normal cardiovascular exam+ dysrhythmias Atrial Fibrillation      Neuro/Psych negative neurological ROS     GI/Hepatic Neg liver ROS, Acute cholecystitis   Endo/Other  Hypothyroidism   Renal/GU Renal InsufficiencyRenal disease  negative genitourinary   Musculoskeletal negative musculoskeletal ROS (+)   Abdominal Normal abdominal exam  (+)   Peds negative pediatric ROS (+)  Hematology negative hematology ROS (+)   Anesthesia Other Findings Past Medical History: No date: Atrial fibrillation (HCC) No date: Coronary artery disease No date: Hyperlipidemia No date: Hypertension EKG with afib and RBBB  Reproductive/Obstetrics                           Anesthesia Physical Anesthesia Plan  ASA: III and emergent  Anesthesia Plan: General   Post-op Pain Management:    Induction: Intravenous and Rapid sequence  Airway Management Planned: Oral ETT  Additional Equipment:   Intra-op Plan:   Post-operative Plan: Extubation in OR  Informed Consent: I have reviewed the patients History and Physical, chart, labs and discussed the procedure including the risks, benefits and alternatives for the proposed anesthesia with the patient or authorized representative who has indicated his/her understanding and acceptance.   Dental advisory given  Plan Discussed with: CRNA and Surgeon  Anesthesia Plan Comments:         Anesthesia Quick  Evaluation

## 2015-12-16 NOTE — Progress Notes (Signed)
Returned from surgery.  Abd wounds are clean and dry. Small amt of serosanguinous drainage.  C/o back pain possibly from OR table, possibly radiating from gallbladder.  Dilaudid 0.5 mg IV given.  Family at bedside.

## 2015-12-16 NOTE — Progress Notes (Signed)
Rocephin infusing. Patient to OR.

## 2015-12-17 ENCOUNTER — Encounter: Payer: Self-pay | Admitting: Surgery

## 2015-12-17 LAB — COMPREHENSIVE METABOLIC PANEL WITH GFR
ALT: 41 U/L (ref 14–54)
AST: 76 U/L — ABNORMAL HIGH (ref 15–41)
Albumin: 2.5 g/dL — ABNORMAL LOW (ref 3.5–5.0)
Alkaline Phosphatase: 184 U/L — ABNORMAL HIGH (ref 38–126)
Anion gap: 7 (ref 5–15)
BUN: 11 mg/dL (ref 6–20)
CO2: 26 mmol/L (ref 22–32)
Calcium: 9.2 mg/dL (ref 8.9–10.3)
Chloride: 104 mmol/L (ref 101–111)
Creatinine, Ser: 0.78 mg/dL (ref 0.44–1.00)
GFR calc Af Amer: >60 mL/min
GFR calc non Af Amer: >60 mL/min
Glucose, Bld: 106 mg/dL — ABNORMAL HIGH (ref 65–99)
Potassium: 3.9 mmol/L (ref 3.5–5.1)
Sodium: 137 mmol/L (ref 135–145)
Total Bilirubin: 1.4 mg/dL — ABNORMAL HIGH (ref 0.3–1.2)
Total Protein: 6.1 g/dL — ABNORMAL LOW (ref 6.5–8.1)

## 2015-12-17 MED ORDER — SIMETHICONE 80 MG PO CHEW
160.0000 mg | CHEWABLE_TABLET | Freq: Four times a day (QID) | ORAL | Status: DC | PRN
  Administered 2015-12-17: 160 mg via ORAL
  Filled 2015-12-17: qty 2

## 2015-12-17 MED ORDER — CIPROFLOXACIN HCL 500 MG PO TABS
500.0000 mg | ORAL_TABLET | Freq: Two times a day (BID) | ORAL | Status: DC
  Administered 2015-12-17 – 2015-12-18 (×2): 500 mg via ORAL
  Filled 2015-12-17 (×2): qty 1

## 2015-12-17 MED ORDER — METRONIDAZOLE 500 MG PO TABS
500.0000 mg | ORAL_TABLET | Freq: Three times a day (TID) | ORAL | Status: DC
  Administered 2015-12-17 – 2015-12-18 (×3): 500 mg via ORAL
  Filled 2015-12-17 (×5): qty 1

## 2015-12-17 NOTE — Progress Notes (Signed)
[facility] [place] NAME: Robin Allen    MRN#:  [id number]  DATE OF BIRTH:  November 08, 1945  SUBJECTIVE:  Hospital Day: 2 days Robin Allen is a 70 y.o. female presenting with Fever and Abdominal Pain .   Overnight events:Cholecystectomy yesterday  Interval Events: no complaints   REVIEW OF SYSTEMS:  CONSTITUTIONAL: No fever, fatigue or weakness.  EYES: No blurred or double vision.  EARS, NOSE, AND THROAT: No tinnitus or ear pain.  RESPIRATORY: No cough, shortness of breath, wheezing or hemoptysis.  CARDIOVASCULAR: No chest pain, orthopnea, edema.  GASTROINTESTINAL: No nausea, vomiting, diarrhea or abdominal pain.  GENITOURINARY: No dysuria, hematuria.  ENDOCRINE: No polyuria, nocturia,  HEMATOLOGY: No anemia, easy bruising or bleeding SKIN: No rash or lesion. MUSCULOSKELETAL: No joint pain or arthritis.   NEUROLOGIC: No tingling, numbness, weakness.  PSYCHIATRY: No anxiety or depression.   DRUG ALLERGIES:   Allergies  Allergen Reactions  . Augmentin [Amoxicillin-Pot Clavulanate]     nausea    VITALS:  Blood pressure 124/85, pulse 94, temperature 97.9 F (36.6 C), temperature source Oral, resp. rate 18, height 5\' 5"  (1.651 m), weight 85.2 kg (187 lb 14.4 oz), SpO2 99 %.  PHYSICAL EXAMINATION:  VITAL SIGNS: Vitals:   12/17/15 0855 12/17/15 1129  BP: 118/78 124/85  Pulse: 90 94  Resp:  18  Temp:  97.9 F (36.6 C)   GENERAL:70 y.o.female currently in no acute distress.  HEAD: Normocephalic, atraumatic.  EYES: Pupils equal, round, reactive to light. Extraocular muscles intact. No scleral icterus.  MOUTH: Moist mucosal membrane. Dentition intact. No abscess noted.  EAR, NOSE, THROAT: Clear without exudates. No external lesions.  NECK: Supple. No thyromegaly. No nodules. No JVD.  PULMONARY: Clear to ascultation, without wheeze rails or rhonci. No use of accessory muscles, Good respiratory effort. good air entry  bilaterally CHEST: Nontender to palpation.  CARDIOVASCULAR: S1 and S2. Regular rate and rhythm. No murmurs, rubs, or gallops. No edema. Pedal pulses 2+ bilaterally.  GASTROINTESTINAL: Soft, some RUQ tenderness with JP drain, nondistended. No masses. Positive bowel sounds. No hepatosplenomegaly.  MUSCULOSKELETAL: No swelling, clubbing, or edema. Range of motion full in all extremities.  NEUROLOGIC: Cranial nerves II through XII are intact. No gross focal neurological deficits. Sensation intact. Reflexes intact.  SKIN: No ulceration, lesions, rashes, or cyanosis. Skin warm and dry. Turgor intact.  PSYCHIATRIC: Mood, affect within normal limits. The patient is awake, alert and oriented x 3. Insight, judgment intact.      LABORATORY PANEL:   CBC  Recent Labs Lab 12/16/15 0742  WBC 10.7  HGB 10.9*  HCT 31.5*  PLT 271   ------------------------------------------------------------------------------------------------------------------  Chemistries   Recent Labs Lab 12/16/15 0742 12/17/15 0944  NA 134* 137  K 3.8 3.9  CL 102 104  CO2 27 26  GLUCOSE 95 106*  BUN 12 11  CREATININE 1.04* 0.78  CALCIUM 9.1 9.2  MG 1.9  --   AST 41 76*  ALT 28 41  ALKPHOS 184* 184*  BILITOT 1.0 1.4*   ------------------------------------------------------------------------------------------------------------------  Cardiac Enzymes No results for input(s): TROPONINI in the last 168 hours. ------------------------------------------------------------------------------------------------------------------  RADIOLOGY:  Dg Cholangiogram Operative  Result Date: 12/16/2015 CLINICAL DATA:  Calculus cholecystitis EXAM: INTRAOPERATIVE CHOLANGIOGRAM TECHNIQUE: Cholangiographic images from the C-arm fluoroscopic device were submitted for interpretation post-operatively. Please see the procedural report for the amount of contrast and the fluoroscopy time utilized. COMPARISON:  12/15/2015 FINDINGS:  Intraoperative cholangiogram performed during the laparoscopic cholecystectomy. Biliary  confluence, common hepatic duct, residual cystic duct, and common common bile duct appear patent. Contrast drains into the duodenum. Leakage of contrast at the cystic duct catheter injection site. IMPRESSION: Patent biliary system. Electronically Signed   By: Jerilynn Mages.  Shick M.D.   On: 12/16/2015 12:43   Mr 3d Recon At Scanner  Result Date: 12/15/2015 CLINICAL DATA:  70 year old female inpatient with elevated liver function tests. EXAM: MRI ABDOMEN WITHOUT AND WITH CONTRAST (INCLUDING MRCP) TECHNIQUE: Multiplanar multisequence MR imaging of the abdomen was performed both before and after the administration of intravenous contrast. Heavily T2-weighted images of the biliary and pancreatic ducts were obtained, and three-dimensional MRCP images were rendered by post processing. CONTRAST:  106mL MULTIHANCE GADOBENATE DIMEGLUMINE 529 MG/ML IV SOLN COMPARISON:  12/14/2015 right upper quadrant abdominal sonogram. 12/08/2015 CT abdomen/pelvis. FINDINGS: Partially motion degraded study. Lower chest: Clear lung bases. Hepatobiliary: Normal liver size and configuration. No hepatic steatosis. 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 No additional liver lesions. The gallbladder is distended (gallbladder diameter 4.4 cm). There is a 7 mm gallstone lodged in the gallbladder neck. There is layering sludge in the gallbladder. There is mild-to-moderate diffuse gallbladder wall thickening and hyperenhancement. There is mild stranding of the pericholecystic fat. There is a small 2.9 x 1.6 x 2.4 cm focal outpouching of the posterior gallbladder neck wall (series 2/ image  20), suspicious for a contained perforation. No intrahepatic biliary ductal dilatation. Common bile duct diameter 6 mm. No choledocholithiasis. No biliary strictures or masses. No ampullary mass. Pancreas: No pancreatic mass or duct dilation.  No pancreas divisum. Spleen: Normal size. No mass. Adrenals/Urinary Tract: Normal adrenals. No hydronephrosis. Normal kidneys with no renal mass. Stomach/Bowel: Grossly normal stomach. Visualized small and large bowel is normal caliber, with no bowel wall thickening. Vascular/Lymphatic: Normal caliber abdominal aorta. Patent portal, splenic, hepatic and renal veins. No pathologically enlarged lymph nodes in the abdomen. Other: No abdominal ascites or focal fluid collection. Musculoskeletal: No aggressive appearing focal osseous lesions. IMPRESSION: 1. Gallstone lodged in the gallbladder neck with gallbladder distention, layering sludge, diffuse gallbladder wall thickening and wall hyperenhancement and mild stranding of the pericholecystic fat. These findings are suggestive of acute cholecystitis. 2. Focal outpouching of the posterior gallbladder neck wall, suspicious for a contained perforation. 3. No biliary ductal dilatation. Common bile duct diameter 6 mm. No choledocholithiasis. These results were called by telephone at the time of interpretation on 12/15/2015 at 6:27 pm to Dr. Olean Ree , who verbally acknowledged these results. Electronically Signed   By: Ilona Sorrel M.D.   On: 12/15/2015 18:27   Mr Abdomen Mrcp Moise Boring Contast  Result Date: 12/15/2015 CLINICAL DATA:  70 year old female inpatient with elevated liver function tests. EXAM: MRI ABDOMEN WITHOUT AND WITH CONTRAST (INCLUDING MRCP) TECHNIQUE: Multiplanar multisequence MR imaging of the abdomen was performed both before and after the administration of intravenous contrast. Heavily T2-weighted images of the biliary and pancreatic ducts were obtained, and three-dimensional MRCP images were rendered by post  processing. CONTRAST:  76mL MULTIHANCE GADOBENATE DIMEGLUMINE 529 MG/ML IV SOLN COMPARISON:  12/14/2015 right upper quadrant abdominal sonogram. 12/08/2015 CT abdomen/pelvis. FINDINGS: Partially motion degraded study. Lower chest: Clear lung bases. Hepatobiliary: Normal liver size and configuration. No hepatic steatosis. 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 No additional liver lesions. The gallbladder is distended (gallbladder diameter 4.4 cm). There is a 7 mm gallstone lodged in the gallbladder neck. There is layering sludge in the gallbladder. There is mild-to-moderate diffuse gallbladder wall thickening and hyperenhancement. There is mild stranding of the pericholecystic fat. There is a small 2.9 x 1.6 x 2.4 cm focal outpouching of the posterior gallbladder neck wall (series 2/ image 20), suspicious for a contained perforation. No intrahepatic biliary ductal dilatation. Common bile duct diameter 6 mm. No choledocholithiasis. No biliary strictures or masses. No ampullary mass. Pancreas: No pancreatic mass or duct dilation.  No pancreas divisum. Spleen: Normal size. No mass. Adrenals/Urinary Tract: Normal adrenals. No hydronephrosis. Normal kidneys with no renal mass. Stomach/Bowel: Grossly normal stomach. Visualized small and large bowel is normal caliber, with no bowel wall thickening. Vascular/Lymphatic: Normal caliber abdominal aorta. Patent portal, splenic, hepatic and renal veins. No pathologically enlarged lymph nodes in the abdomen. Other: No abdominal ascites or focal fluid collection. Musculoskeletal: No aggressive appearing focal osseous lesions. IMPRESSION: 1. Gallstone lodged in the gallbladder neck with gallbladder  distention, layering sludge, diffuse gallbladder wall thickening and wall hyperenhancement and mild stranding of the pericholecystic fat. These findings are suggestive of acute cholecystitis. 2. Focal outpouching of the posterior gallbladder neck wall, suspicious for a contained perforation. 3. No biliary ductal dilatation. Common bile duct diameter 6 mm. No choledocholithiasis. These results were called by telephone at the time of interpretation on 12/15/2015 at 6:27 pm to Dr. Olean Ree , who verbally acknowledged these results. Electronically Signed   By: Ilona Sorrel M.D.   On: 12/15/2015 18:27    EKG:   Orders placed or performed during the hospital encounter of 12/08/15  . ED EKG  . ED EKG    ASSESSMENT AND PLAN:   Robin Allen is a 70 y.o. female presenting with Fever and Abdominal Pain . Admitted 12/14/2015 : Day #: 2 days   1. UTI: continue current antibiotics, follow culture data 2. Acute cholecystitis with possible perforation"Postop day 1 cholecystectomy JP drain in place 3. Hypokalemia: Resolved 4. Hypertension: hold htcz continue lopressor   All the records are reviewed and case discussed with Care Management/Social Workerr. Management plans discussed with the patient, family and they are in agreement.  CODE STATUS: full TOTAL TIME TAKING CARE OF THIS PATIENT: 28 minutes.   POSSIBLE D/C IN 2-3DAYS, DEPENDING ON CLINICAL CONDITION.   Brittny Spangle,  Karenann Cai.D on 12/17/2015 at 1:17 PM  Between 7am to 6pm - Pager - [phone]  After 6pm: House Pager: - [facility]  Office  [phone]  CC: Primary care physician; Madelyn Brunner, MD

## 2015-12-17 NOTE — Anesthesia Postprocedure Evaluation (Signed)
Anesthesia Post Note  Patient: Robin Allen  Procedure(s) Performed: Procedure(s) (LRB): LAPAROSCOPIC CHOLECYSTECTOMY  with cholangiogram (N/A)  Patient location during evaluation: PACU Anesthesia Type: General Level of consciousness: awake and alert and oriented Pain management: pain level controlled Vital Signs Assessment: post-procedure vital signs reviewed and stable Respiratory status: spontaneous breathing Cardiovascular status: blood pressure returned to baseline Anesthetic complications: no    Last Vitals:  Vitals:   12/16/15 1501 12/16/15 1917  BP: 109/70 (!) 146/82  Pulse: 89 87  Resp: 20 18  Temp:  36.9 C    Last Pain:  Vitals:   12/16/15 2305  TempSrc:   PainSc: 9                  Harace Mccluney

## 2015-12-17 NOTE — Care Management (Signed)
Patient admitted with cholelithiasis and had lap cholecystectomy with inoperative cholangiogram 12/3.  Large amount of drainage via jp drains. Discussed mobilization of patient during progression.

## 2015-12-17 NOTE — Progress Notes (Signed)
S/p lap chole POD # 1 Doing well Still having some significant pain Serous jp AVSS  PE NAD Abd: soft, incision c/d/i, serous output from JP. No peritonitis  A/P doing well DC in am heplock ivf mobilize

## 2015-12-18 LAB — SURGICAL PATHOLOGY

## 2015-12-18 MED ORDER — ONDANSETRON HCL 4 MG PO TABS: 4.0000 mg | ORAL_TABLET | Freq: Four times a day (QID) | ORAL | 0 refills | Status: DC | PRN

## 2015-12-18 MED ORDER — CIPROFLOXACIN HCL 500 MG PO TABS
500.0000 mg | ORAL_TABLET | Freq: Two times a day (BID) | ORAL | 0 refills | Status: AC
Start: 2015-12-18 — End: 2015-12-25

## 2015-12-18 MED ORDER — METRONIDAZOLE 500 MG PO TABS: 500.0000 mg | ORAL_TABLET | Freq: Three times a day (TID) | ORAL | 0 refills | Status: AC

## 2015-12-18 MED ORDER — OXYCODONE HCL 5 MG PO TABS: 5.0000 mg | ORAL_TABLET | ORAL | 0 refills | Status: DC | PRN

## 2015-12-18 NOTE — Progress Notes (Signed)
Patient is being discharge home to self care, discharge instruction provided, iv removed, JP drain intact in place, discharge instruction provided about drain care and s/s of infection with family and patient.

## 2015-12-18 NOTE — Care Management Important Message (Signed)
Important Message  Patient Details  Name: Robin Allen MRN: [id number] Date of Birth: Nov 14, 1945   Medicare Important Message Given:  Yes    Katrina Stack, RN 12/18/2015, 12:24 PM

## 2015-12-18 NOTE — Care Management (Signed)
No discharge needs. Discussed patient/ family instruction of jp drain maintenance during progression. Tolerating diet, ambulatory and pain controlled.

## 2015-12-18 NOTE — Progress Notes (Signed)
S/p lap chole 30cc serous from drain Taking Po, some nausea  PE NAD Abd: incisions c/d/i, no infection jp serous, no peritonitis  A/p DC home w JP  F/U w [place] next week

## 2015-12-18 NOTE — Discharge Summary (Signed)
[facility] [place] NAME: Robin Allen    MR#:  [id number]  DATE OF BIRTH:  1946-01-05  DATE OF ADMISSION:  12/14/2015 ADMITTING PHYSICIAN: Lance Coon, MD  DATE OF DISCHARGE: 12/18/15  PRIMARY CARE PHYSICIAN: Madelyn Brunner, MD    ADMISSION DIAGNOSIS:  Right upper quadrant abdominal pain [R10.11] Abdominal pain [R10.9]  DISCHARGE DIAGNOSIS:  Principal Problem: Acute cholangitis    UTI (urinary tract infection) Active Problems:   Atrial fibrillation (H[facility])   Hyperlipidemia   Hypertension   Symptomatic cholelithiasis   AKI (acute kidney injury) ([place])   Elevated LFTs   Cholecystitis, acute   SECONDARY DIAGNOSIS:   Past Medical History:  Diagnosis Date  . Atrial fibrillation ([place])   . Coronary artery disease   . Hyperlipidemia   . Hypertension     HOSPITAL COURSE:  Robin Allen  is a 70 y.o. female admitted 12/14/2015 with chief complaint Fever and Abdominal Pain . Please see H&P performed by Lance Coon, MD for further information. Patient presented with the above symptoms found to have UTI. Noted elevated bilirubin - MRCP -   1. Gallstone lodged in the gallbladder neck with gallbladder distention, layering sludge, diffuse gallbladder wall thickening and wall hyperenhancement and mild stranding of the pericholecystic fat. These findings are suggestive of acute cholecystitis. 2. Focal outpouching of the posterior gallbladder neck wall, suspicious for a contained perforation. 3. No biliary ductal dilatation. Common bile duct diameter 6 mm. No Choledocholithiasis.  Underwent Laparoscopic cholecystectomy with intraoperative cholangiogram [id number] without complication   DISCHARGE CONDITIONS:   stable  CONSULTS OBTAINED:  Treatment Team:  Hubbard Robinson, MD  DRUG ALLERGIES:   Allergies  Allergen Reactions  . Augmentin [Amoxicillin-Pot Clavulanate]     nausea    DISCHARGE MEDICATIONS:   Current  Discharge Medication List    START taking these medications   Details  ciprofloxacin (CIPRO) 500 MG tablet Take 1 tablet (500 mg total) by mouth 2 (two) times daily. Qty: 14 tablet, Refills: 0    metroNIDAZOLE (FLAGYL) 500 MG tablet Take 1 tablet (500 mg total) by mouth every 8 (eight) hours. Qty: 21 tablet, Refills: 0    !! ondansetron (ZOFRAN) 4 MG tablet Take 1 tablet (4 mg total) by mouth every 6 (six) hours as needed for nausea. Qty: 20 tablet, Refills: 0    oxyCODONE (OXY IR/ROXICODONE) 5 MG immediate release tablet Take 1-2 tablets (5-10 mg total) by mouth every 4 (four) hours as needed for moderate pain or severe pain. Qty: 30 tablet, Refills: 0     !! - Potential duplicate medications found. Please discuss with provider.    CONTINUE these medications which have NOT CHANGED   Details  allopurinol (ZYLOPRIM) 100 MG tablet TAKE 1 TABLET BY MOUTH ONCE DAILY    aspirin EC 81 MG tablet Take 81 mg by mouth daily.    dicyclomine (BENTYL) 20 MG tablet Take 1 tablet (20 mg total) by mouth 3 (three) times daily as needed for spasms. Qty: 30 tablet, Refills: 0    Garlic 123XX123 MG TABS Take by mouth.    metoprolol (LOPRESSOR) 50 MG tablet TAKE 1 TABLET BY MOUTH TWICE DAILY.    !! ondansetron (ZOFRAN) 4 MG tablet Take 1 tablet (4 mg total) by mouth every 8 (eight) hours as needed for nausea or vomiting. Qty: 21 tablet, Refills: 0    triamterene-hydrochlorothiazide (DYAZIDE) 37.5-25 MG capsule TAKE 1 CAPSULE BY MOUTH ONCE EVERY MORNING     !! -  Potential duplicate medications found. Please discuss with provider.       DISCHARGE INSTRUCTIONS:    DIET:  Regular diet  DISCHARGE CONDITION:  Stable  ACTIVITY:  Activity as tolerated  OXYGEN:  Home Oxygen: No.   Oxygen Delivery: room air  DISCHARGE LOCATION:  home   If you experience worsening of your admission symptoms, develop shortness of breath, life threatening emergency, suicidal or homicidal thoughts you must seek  medical attention immediately by calling 911 or calling your MD immediately  if symptoms less severe.  You Must read complete instructions/literature along with all the possible adverse reactions/side effects for all the Medicines you take and that have been prescribed to you. Take any new Medicines after you have completely understood and accpet all the possible adverse reactions/side effects.   Please note  You were cared for by a hospitalist during your hospital stay. If you have any questions about your discharge medications or the care you received while you were in the hospital after you are discharged, you can call the unit and asked to speak with the hospitalist on call if the hospitalist that took care of you is not available. Once you are discharged, your primary care physician will handle any further medical issues. Please note that NO REFILLS for any discharge medications will be authorized once you are discharged, as it is imperative that you return to your primary care physician (or establish a relationship with a primary care physician if you do not have one) for your aftercare needs so that they can reassess your need for medications and monitor your lab values.    On the day of Discharge:   VITAL SIGNS:  Blood pressure 130/87, pulse (!) 104, temperature 98.3 F (36.8 C), temperature source Oral, resp. rate 18, height 5\' 5"  (1.651 m), weight 85.2 kg (187 lb 14.4 oz), SpO2 97 %.  I/O:   Intake/Output Summary (Last 24 hours) at 12/18/15 1011 Last data filed at 12/18/15 0600  Gross per 24 hour  Intake              220 ml  Output              730 ml  Net             -510 ml    PHYSICAL EXAMINATION:  GENERAL:  70 y.o.-year-old patient lying in the bed with no acute distress.  EYES: Pupils equal, round, reactive to light and accommodation. No scleral icterus. Extraocular muscles intact.  HEENT: Head atraumatic, normocephalic. Oropharynx and nasopharynx clear.  NECK:  Supple,  no jugular venous distention. No thyroid enlargement, no tenderness.  LUNGS: Normal breath sounds bilaterally, no wheezing, rales,rhonchi or crepitation. No use of accessory muscles of respiration.  CARDIOVASCULAR: S1, S2 normal. No murmurs, rubs, or gallops.  ABDOMEN: Soft, non-tender, non-distended. Bowel sounds present. No organomegaly or mass.  EXTREMITIES: No pedal edema, cyanosis, or clubbing.  NEUROLOGIC: Cranial nerves II through XII are intact. Muscle strength 5/5 in all extremities. Sensation intact. Gait not checked.  PSYCHIATRIC: The patient is alert and oriented x 3.  SKIN: No obvious rash, lesion, or ulcer.   RUQ JP drain in place  DATA REVIEW:   CBC  Recent Labs Lab 12/16/15 0742  WBC 10.7  HGB 10.9*  HCT 31.5*  PLT 271    Chemistries   Recent Labs Lab 12/16/15 0742 12/17/15 0944  NA 134* 137  K 3.8 3.9  CL 102 104  CO2 27 26  GLUCOSE  95 106*  BUN 12 11  CREATININE 1.04* 0.78  CALCIUM 9.1 9.2  MG 1.9  --   AST 41 76*  ALT 28 41  ALKPHOS 184* 184*  BILITOT 1.0 1.4*    Cardiac Enzymes No results for input(s): TROPONINI in the last 168 hours.  Microbiology Results  Results for orders placed or performed during the hospital encounter of 12/14/15  Urine culture     Status: Abnormal   Collection Time: 12/14/15  7:10 PM  Result Value Ref Range Status   Specimen Description URINE, CLEAN CATCH  Final   Special Requests NONE  Final   Culture MULTIPLE SPECIES PRESENT, SUGGEST RECOLLECTION (A)  Final   Report Status 12/16/2015 FINAL  Final    RADIOLOGY:  Dg Cholangiogram Operative  Result Date: 12/16/2015 CLINICAL DATA:  Calculus cholecystitis EXAM: INTRAOPERATIVE CHOLANGIOGRAM TECHNIQUE: Cholangiographic images from the C-arm fluoroscopic device were submitted for interpretation post-operatively. Please see the procedural report for the amount of contrast and the fluoroscopy time utilized. COMPARISON:  12/15/2015 FINDINGS: Intraoperative cholangiogram  performed during the laparoscopic cholecystectomy. Biliary confluence, common hepatic duct, residual cystic duct, and common common bile duct appear patent. Contrast drains into the duodenum. Leakage of contrast at the cystic duct catheter injection site. IMPRESSION: Patent biliary system. Electronically Signed   By: Jerilynn Mages.  Shick M.D.   On: 12/16/2015 12:43     Management plans discussed with the patient, family and they are in agreement.  CODE STATUS:     Code Status Orders        Start     Ordered   12/15/15 0258  Full code  Continuous     12/15/15 0257    Code Status History    Date Active Date Inactive Code Status Order ID Comments User Context   This patient has a current code status but no historical code status.    Advance Directive Documentation   Flowsheet Row Most Recent Value  Type of Advance Directive  Healthcare Power of Attorney  Pre-existing out of facility DNR order (yellow form or pink MOST form)  No data  "MOST" Form in Place?  No data      TOTAL TIME TAKING CARE OF THIS PATIENT: 33 minutes.    Rosie Torrez,  Karenann Cai.D on 12/18/2015 at 10:11 AM  Between 7am to 6pm - Pager - [phone]  After 6pm go to [email or web address] - Proofreader  [facility]  Office  [phone]  CC: Primary care physician; Madelyn Brunner, MD

## 2015-12-21 ENCOUNTER — Telehealth: Payer: Self-pay | Admitting: Surgery

## 2015-12-21 NOTE — Telephone Encounter (Signed)
Patients daughter, Marliss Czar, called and needs some instructions helping her mother bath. She stated she wasn't given any after surgery.

## 2015-12-21 NOTE — Telephone Encounter (Signed)
Lap chole 12/3

## 2015-12-21 NOTE — Telephone Encounter (Signed)
Spoke with patient's daughter, Marliss Czar at this time. Reviewed bathing instructions, drain care, and dietary guidelines.   The drain is continuing to drain a minimal amount at this time per daughter.  She was asked to call back with any further questions that she may have. She verbalized understanding of this information.

## 2015-12-25 ENCOUNTER — Encounter: Payer: Self-pay | Admitting: Surgery

## 2015-12-25 ENCOUNTER — Telehealth: Payer: Self-pay

## 2015-12-25 ENCOUNTER — Other Ambulatory Visit
Admission: RE | Admit: 2015-12-25 | Discharge: 2015-12-25 | Disposition: A | Discharge status: Home / Self Care | Payer: Commercial Managed Care - HMO | Source: Ambulatory Visit | Attending: Surgery | Admitting: Surgery

## 2015-12-25 ENCOUNTER — Ambulatory Visit (INDEPENDENT_AMBULATORY_CARE_PROVIDER_SITE_OTHER): Payer: Commercial Managed Care - HMO | Admitting: Surgery

## 2015-12-25 ENCOUNTER — Other Ambulatory Visit: Payer: Self-pay

## 2015-12-25 VITALS — BP 132/66 | HR 105 | Temp 97.8°F | Ht 65.0 in | Wt 181.0 lb

## 2015-12-25 DIAGNOSIS — K8000 Calculus of gallbladder with acute cholecystitis without obstruction: Secondary | ICD-10-CM

## 2015-12-25 DIAGNOSIS — Z9049 Acquired absence of other specified parts of digestive tract: Secondary | ICD-10-CM | POA: Diagnosis not present

## 2015-12-25 LAB — COMPREHENSIVE METABOLIC PANEL
ALK PHOS: 137 U/L — AB (ref 38–126)
ALT: 19 U/L (ref 14–54)
ANION GAP: 11 (ref 5–15)
AST: 33 U/L (ref 15–41)
Albumin: 3.3 g/dL — ABNORMAL LOW (ref 3.5–5.0)
BILIRUBIN TOTAL: 0.5 mg/dL (ref 0.3–1.2)
BUN: 17 mg/dL (ref 6–20)
CALCIUM: 9.8 mg/dL (ref 8.9–10.3)
CO2: 25 mmol/L (ref 22–32)
Chloride: 97 mmol/L — ABNORMAL LOW (ref 101–111)
Creatinine, Ser: 1.18 mg/dL — ABNORMAL HIGH (ref 0.44–1.00)
GFR, EST AFRICAN AMERICAN: 52 mL/min — AB (ref >60)
GFR, EST NON AFRICAN AMERICAN: 45 mL/min — AB (ref >60)
Glucose, Bld: 110 mg/dL — ABNORMAL HIGH (ref 65–99)
Potassium: 3.6 mmol/L (ref 3.5–5.1)
SODIUM: 133 mmol/L — AB (ref 135–145)
TOTAL PROTEIN: 7.2 g/dL (ref 6.5–8.1)

## 2015-12-25 MED ORDER — OXYCODONE HCL 5 MG PO TABS: 5.0000 mg | ORAL_TABLET | ORAL | 0 refills | Status: AC | PRN

## 2015-12-25 NOTE — Patient Instructions (Signed)
Please be sure to drink plenty of water daily, at least 72 ounces daily. Please keep a dry bandage over the drain site until there is no more drainage. You may remove the bandage tomorrow and shower. Please see your follow up appointment listed below.

## 2015-12-25 NOTE — Progress Notes (Signed)
Outpatient postop visit  [id number]  Robin Allen is an 70 y.o. female.    Procedure: Laparoscope cholecystectomy with cholangiograms  CC: Minimal pain  HPI: Patient had a necrotic gallbladder and require laparoscopic cholecystectomy. She is feeling much better now has a drain in place with no bilious output. She's had no nausea or vomiting is requesting more narcotics for pain. Patient has had some dizziness when she gets up. Medications reviewed.    Physical Exam:  BP 132/66   Pulse (!) 105   Temp 97.8 F (36.6 C) (Oral)   Ht 5\' 5"  (1.651 m)   Wt 181 lb (82.1 kg)   BMI 30.12 kg/m     PE: No icterus no jaundice abdomen is soft nontender no bile in drain drain is removed dressing is placed calves are nontender    Assessment/Plan:  Discuss dizziness and hydration. She has an appointment with her PCP this afternoon. Currently she is doing quite well drain has been removed she will follow-up with Dr. per square next week we'll refill her oxycodone  Florene Glen, MD, FACS

## 2015-12-25 NOTE — Telephone Encounter (Signed)
Spoke with patient at this time. Normal labs per Dr.Cooper. Patient verbalized understanding.

## 2015-12-31 ENCOUNTER — Ambulatory Visit (INDEPENDENT_AMBULATORY_CARE_PROVIDER_SITE_OTHER): Payer: Commercial Managed Care - HMO | Admitting: Surgery

## 2015-12-31 ENCOUNTER — Encounter: Payer: Self-pay | Admitting: Surgery

## 2015-12-31 VITALS — BP 113/73 | HR 88 | Temp 97.8°F | Ht 65.0 in | Wt 178.0 lb

## 2015-12-31 DIAGNOSIS — Z9049 Acquired absence of other specified parts of digestive tract: Secondary | ICD-10-CM

## 2015-12-31 NOTE — Patient Instructions (Signed)
Please call our office with any questions or concerns. 

## 2015-12-31 NOTE — Progress Notes (Signed)
12/31/2015  HPI: Patient is status post laparoscopic cholecystectomy with intraoperative cholangiogram on 12/3. Her gallbladder was gangrenous with pericholecystic abscess and a JP drain had been left in place. She finished her antibiotic course and on her last appointment her drain was removed by Dr. Burt Knack. She reports that she continues to improve well. She did mention having some dizziness initially that has been improving each day. Her energy level has also improved. Denies having any fevers, chills, chest pain, shortness of breath, worsening pain.  Vital signs: BP 113/73   Pulse 88   Temp 97.8 F (36.6 C) (Oral)   Ht 5\' 5"  (1.651 m)   Wt 80.7 kg (178 lb)   BMI 29.62 kg/m    Physical Exam: Constitutional: No acute distress Abdomen: Soft, nondistended, nontender to palpation. All 4 incisions are clean dry and intact with no evidence of infection.  Assessment/Plan: 70 year old female status post laparoscopic cholecystectomy with an intraoperative cholangiogram.  -Have reassured the patient that her symptoms should continue to improve and that she is healing well. -She may follow-up with [place] on an as-needed basis. She still has a no heavy lifting restriction of more than 10-15 pounds until 12/31. No diet restrictions at this point.   Melvyn Neth, [place]

## 2016-02-26 DIAGNOSIS — I482 Chronic atrial fibrillation: Secondary | ICD-10-CM | POA: Diagnosis not present

## 2016-02-26 DIAGNOSIS — E2839 Other primary ovarian failure: Secondary | ICD-10-CM | POA: Diagnosis not present

## 2016-02-26 DIAGNOSIS — I10 Essential (primary) hypertension: Secondary | ICD-10-CM | POA: Diagnosis not present

## 2016-02-26 DIAGNOSIS — Z1231 Encounter for screening mammogram for malignant neoplasm of breast: Secondary | ICD-10-CM | POA: Diagnosis not present

## 2016-03-05 DIAGNOSIS — M8588 Other specified disorders of bone density and structure, other site: Secondary | ICD-10-CM | POA: Diagnosis not present

## 2016-05-27 DIAGNOSIS — M109 Gout, unspecified: Secondary | ICD-10-CM | POA: Diagnosis not present

## 2016-08-25 DIAGNOSIS — E7800 Pure hypercholesterolemia, unspecified: Secondary | ICD-10-CM | POA: Diagnosis not present

## 2016-08-25 DIAGNOSIS — I482 Chronic atrial fibrillation: Secondary | ICD-10-CM | POA: Diagnosis not present

## 2016-08-25 DIAGNOSIS — N289 Disorder of kidney and ureter, unspecified: Secondary | ICD-10-CM | POA: Diagnosis not present

## 2016-08-25 DIAGNOSIS — I10 Essential (primary) hypertension: Secondary | ICD-10-CM | POA: Diagnosis not present

## 2016-08-25 DIAGNOSIS — Z0001 Encounter for general adult medical examination with abnormal findings: Secondary | ICD-10-CM | POA: Diagnosis not present

## 2016-08-25 DIAGNOSIS — Z0000 Encounter for general adult medical examination without abnormal findings: Secondary | ICD-10-CM | POA: Diagnosis not present

## 2016-12-11 DIAGNOSIS — H04129 Dry eye syndrome of unspecified lacrimal gland: Secondary | ICD-10-CM | POA: Diagnosis not present

## 2016-12-11 DIAGNOSIS — H2513 Age-related nuclear cataract, bilateral: Secondary | ICD-10-CM | POA: Diagnosis not present

## 2018-05-20 IMAGING — US US ABDOMEN LIMITED
1 series · 13 of 25 positions shown · non-contrast
Comparison: CT 12/08/2015

CLINICAL DATA: Right upper quadrant pain

EXAM:
US ABDOMEN LIMITED - RIGHT UPPER QUADRANT

[Series 1: us abdomen limited · 0.27mm/px · 13 of 50 slices shown]
[im 1/50]
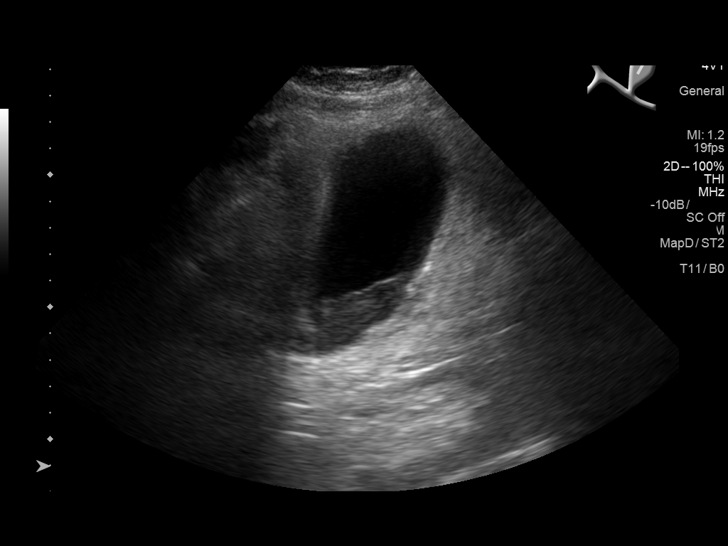
[im 5/50]
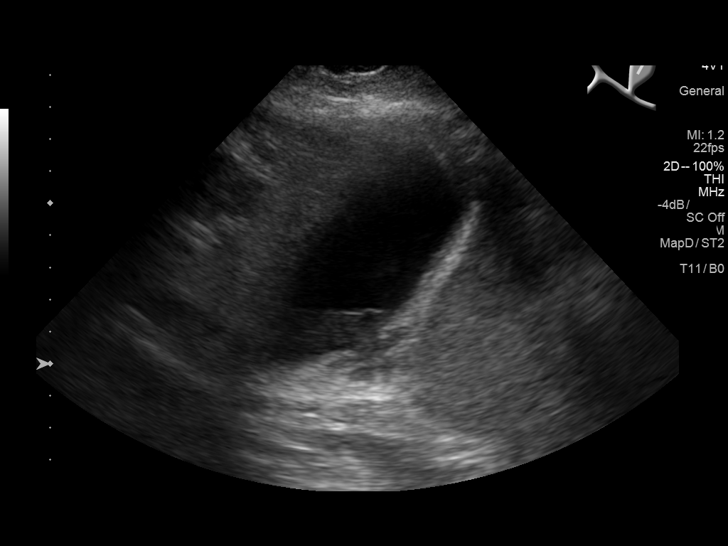
[im 9/50]
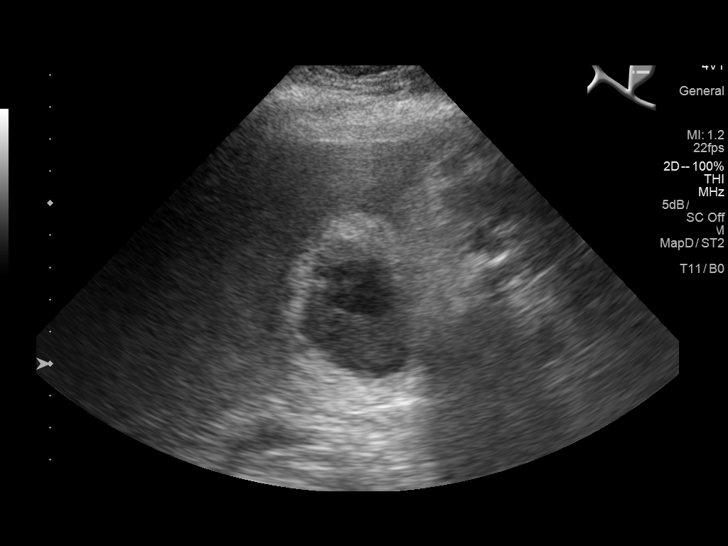
[im 13/50]
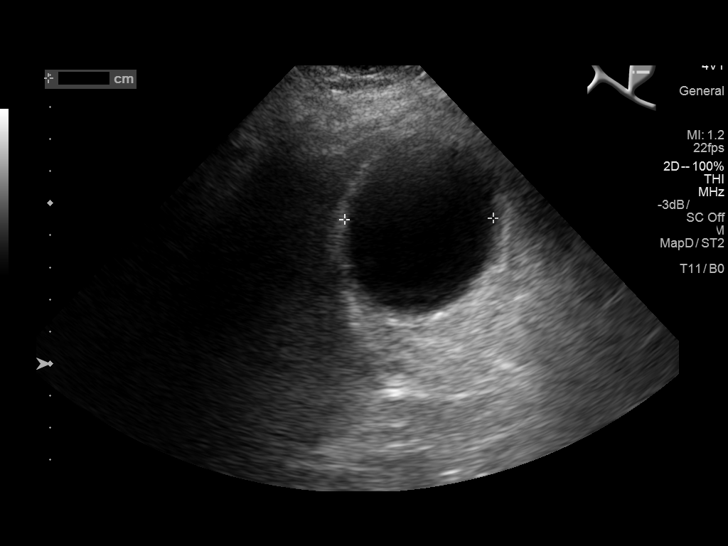
[im 17/50]
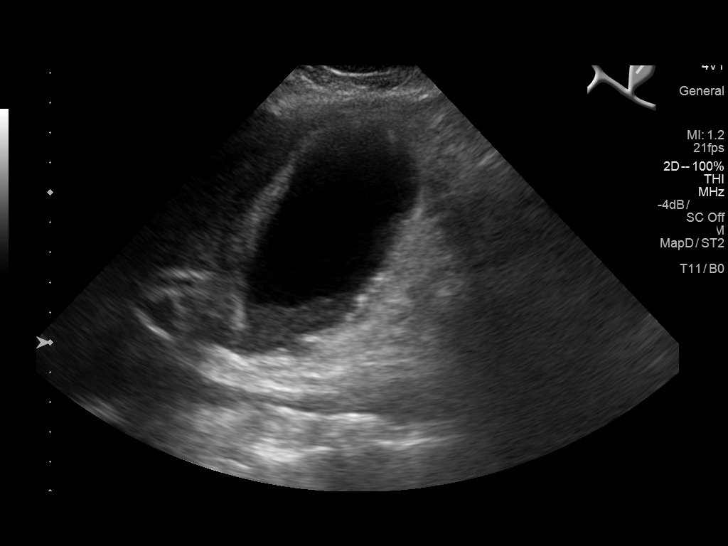
[im 21/50]
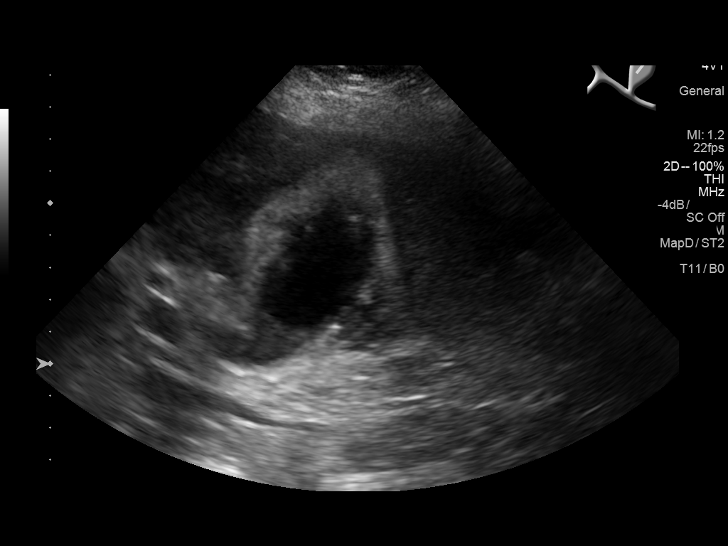
[im 25/50]
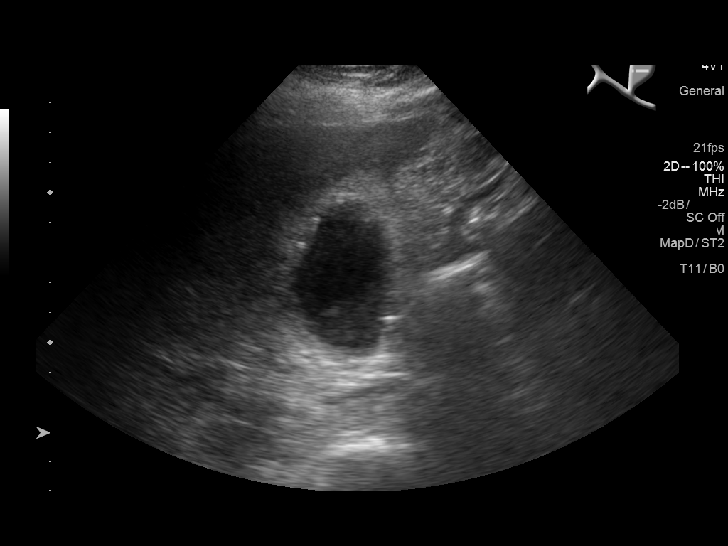
[im 29/50]
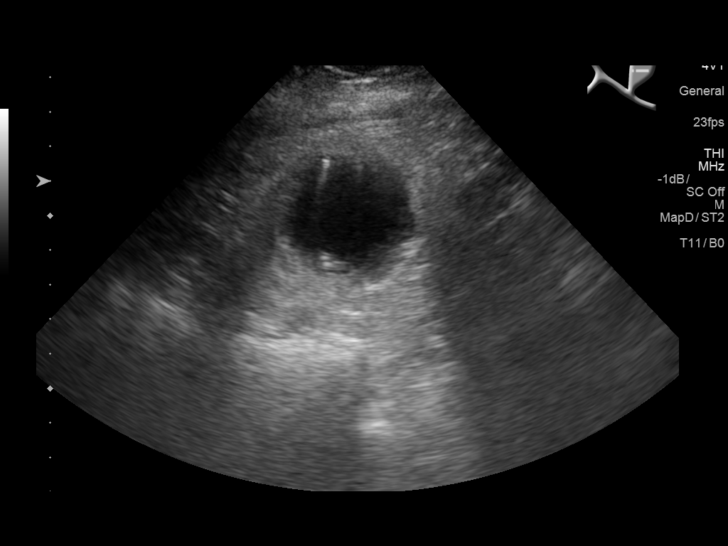
[im 33/50]
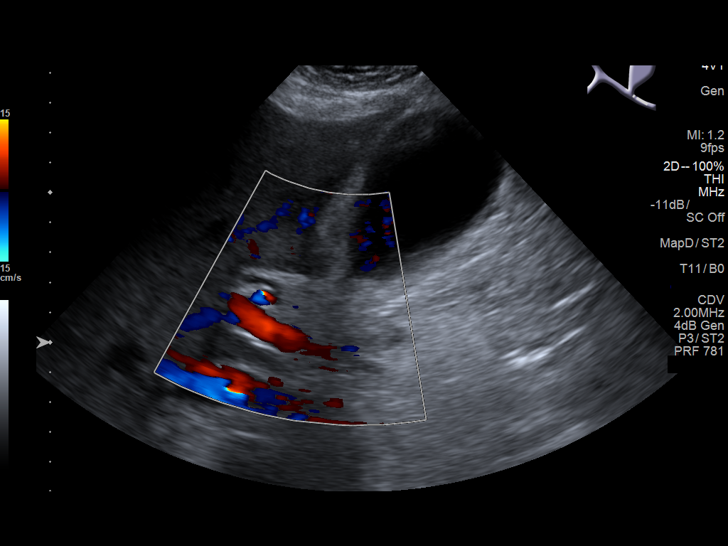
[im 37/50]
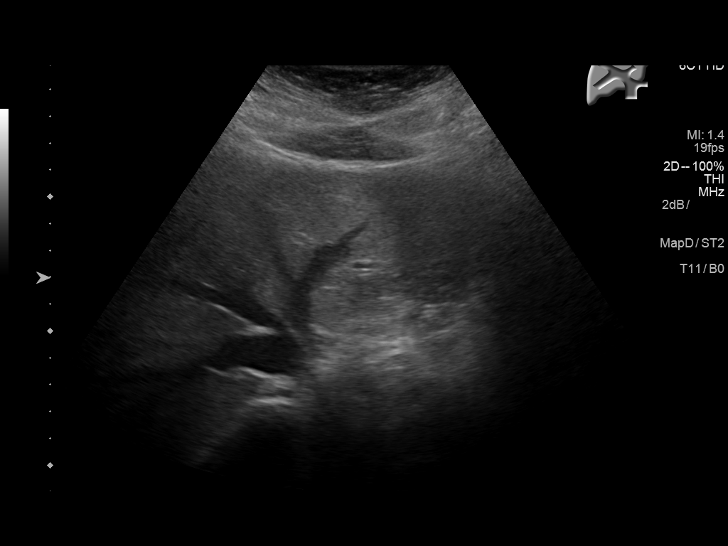
[im 41/50]
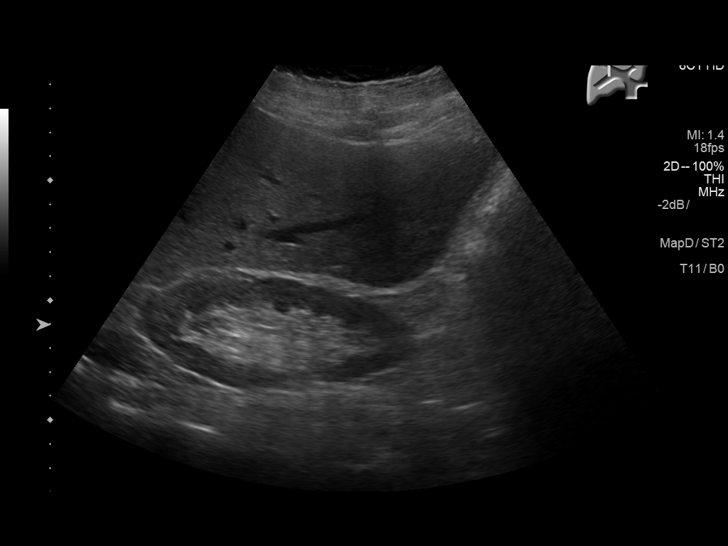
[im 45/50]
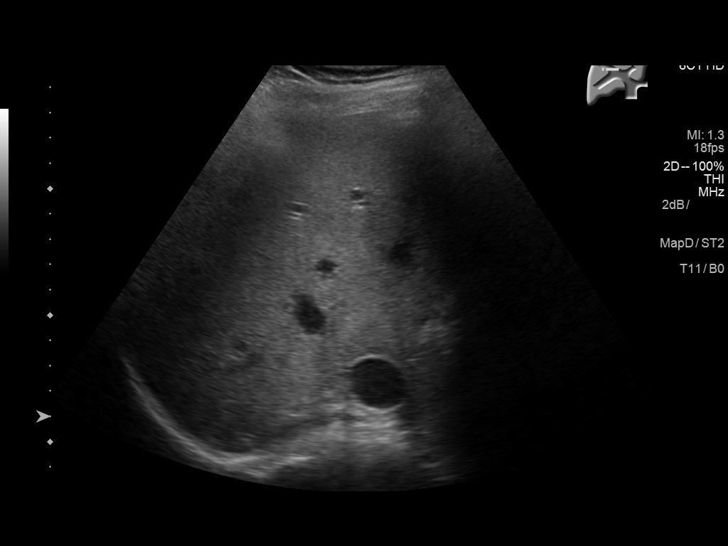
[im 50/50]
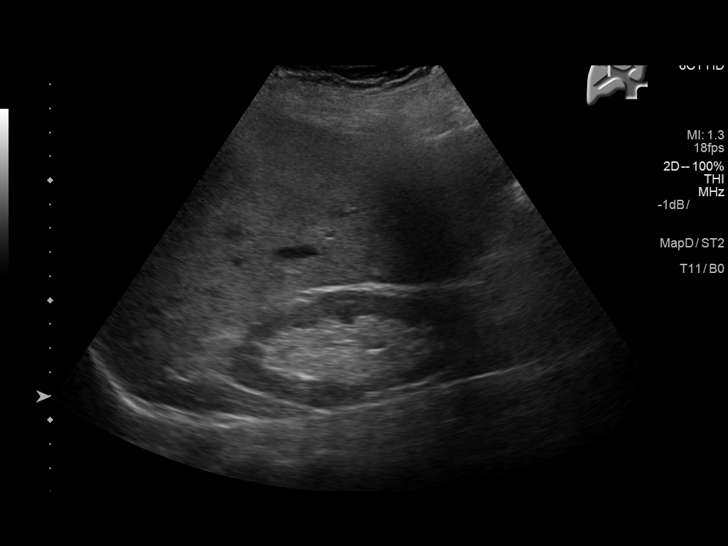

[13 of 25 positions shown; findings below may reference images not displayed]

FINDINGS: Gallbladder:

Gallbladder is slightly dilated measuring up to 4.6 cm in diameter.
Moderate sludge is present within the lumen with small echodense
stones. Additional echodense foci within the wall of the gallbladder
compatible with adenomyomatosis. Gallbladder wall is slightly
thickened at 4 mm. Small amount of peri cholecystic fluid.
Sonographer reports positive sonographic Murphy's sign. Questionable
focal defect within the inferior wall of the gallbladder with
surrounding sludge.

Common bile duct:

Diameter: Normal at 3.1 mm

Liver:

No focal lesion identified. Within normal limits in parenchymal
echogenicity. Cysts noted on prior CT are not well demonstrated.
IMPRESSION: 1. Dilated gallbladder containing moderate sludge and stones. There
is wall thickening, small peri cholecystic fluid and positive
sonographic Baudouin, collective findings would be suspicious for
acute cholecystitis. Questionable small focal area of wall
discontinuity involving the dependent portion of the gallbladder, a
small perforation cannot be excluded.
2. No biliary dilatation

## 2019-01-17 DIAGNOSIS — H2513 Age-related nuclear cataract, bilateral: Secondary | ICD-10-CM | POA: Diagnosis not present

## 2019-01-17 DIAGNOSIS — Z0100 Encounter for examination of eyes and vision without abnormal findings: Secondary | ICD-10-CM | POA: Diagnosis not present

## 2019-01-17 DIAGNOSIS — H04129 Dry eye syndrome of unspecified lacrimal gland: Secondary | ICD-10-CM | POA: Diagnosis not present

## 2019-02-08 DIAGNOSIS — U071 COVID-19: Secondary | ICD-10-CM | POA: Diagnosis not present

## 2019-02-08 DIAGNOSIS — Z20822 Contact with and (suspected) exposure to covid-19: Secondary | ICD-10-CM | POA: Diagnosis not present

## 2019-04-18 DIAGNOSIS — Z87891 Personal history of nicotine dependence: Secondary | ICD-10-CM | POA: Diagnosis not present

## 2019-04-18 DIAGNOSIS — I129 Hypertensive chronic kidney disease with stage 1 through stage 4 chronic kidney disease, or unspecified chronic kidney disease: Secondary | ICD-10-CM | POA: Diagnosis not present

## 2019-04-18 DIAGNOSIS — M8580 Other specified disorders of bone density and structure, unspecified site: Secondary | ICD-10-CM | POA: Diagnosis not present

## 2019-04-18 DIAGNOSIS — M109 Gout, unspecified: Secondary | ICD-10-CM | POA: Diagnosis not present

## 2019-04-18 DIAGNOSIS — F419 Anxiety disorder, unspecified: Secondary | ICD-10-CM | POA: Diagnosis not present

## 2019-04-18 DIAGNOSIS — Z79899 Other long term (current) drug therapy: Secondary | ICD-10-CM | POA: Diagnosis not present

## 2019-04-18 DIAGNOSIS — N1831 Chronic kidney disease, stage 3a: Secondary | ICD-10-CM | POA: Diagnosis not present

## 2019-04-18 DIAGNOSIS — I4891 Unspecified atrial fibrillation: Secondary | ICD-10-CM | POA: Diagnosis not present

## 2019-04-18 DIAGNOSIS — E7800 Pure hypercholesterolemia, unspecified: Secondary | ICD-10-CM | POA: Diagnosis not present

## 2019-04-21 DIAGNOSIS — E7800 Pure hypercholesterolemia, unspecified: Secondary | ICD-10-CM | POA: Diagnosis not present

## 2019-04-27 DIAGNOSIS — H903 Sensorineural hearing loss, bilateral: Secondary | ICD-10-CM | POA: Diagnosis not present

## 2019-10-17 DIAGNOSIS — I10 Essential (primary) hypertension: Secondary | ICD-10-CM | POA: Diagnosis not present

## 2019-10-17 DIAGNOSIS — E7800 Pure hypercholesterolemia, unspecified: Secondary | ICD-10-CM | POA: Diagnosis not present

## 2019-10-17 DIAGNOSIS — Z0000 Encounter for general adult medical examination without abnormal findings: Secondary | ICD-10-CM | POA: Diagnosis not present

## 2019-10-27 DIAGNOSIS — Z23 Encounter for immunization: Secondary | ICD-10-CM | POA: Diagnosis not present

## 2019-10-27 DIAGNOSIS — M109 Gout, unspecified: Secondary | ICD-10-CM | POA: Diagnosis not present

## 2019-10-27 DIAGNOSIS — I4891 Unspecified atrial fibrillation: Secondary | ICD-10-CM | POA: Diagnosis not present

## 2019-10-27 DIAGNOSIS — N1831 Chronic kidney disease, stage 3a: Secondary | ICD-10-CM | POA: Diagnosis not present

## 2019-10-27 DIAGNOSIS — M8580 Other specified disorders of bone density and structure, unspecified site: Secondary | ICD-10-CM | POA: Diagnosis not present

## 2019-10-27 DIAGNOSIS — E7800 Pure hypercholesterolemia, unspecified: Secondary | ICD-10-CM | POA: Diagnosis not present

## 2019-10-27 DIAGNOSIS — F419 Anxiety disorder, unspecified: Secondary | ICD-10-CM | POA: Diagnosis not present

## 2019-10-27 DIAGNOSIS — I129 Hypertensive chronic kidney disease with stage 1 through stage 4 chronic kidney disease, or unspecified chronic kidney disease: Secondary | ICD-10-CM | POA: Diagnosis not present

## 2019-10-27 DIAGNOSIS — Z0000 Encounter for general adult medical examination without abnormal findings: Secondary | ICD-10-CM | POA: Diagnosis not present

## 2020-01-17 DIAGNOSIS — H2513 Age-related nuclear cataract, bilateral: Secondary | ICD-10-CM | POA: Diagnosis not present

## 2020-01-17 DIAGNOSIS — H04129 Dry eye syndrome of unspecified lacrimal gland: Secondary | ICD-10-CM | POA: Diagnosis not present

## 2020-01-17 DIAGNOSIS — Z0100 Encounter for examination of eyes and vision without abnormal findings: Secondary | ICD-10-CM | POA: Diagnosis not present

## 2020-05-25 DIAGNOSIS — F419 Anxiety disorder, unspecified: Secondary | ICD-10-CM | POA: Diagnosis not present

## 2020-05-25 DIAGNOSIS — I4891 Unspecified atrial fibrillation: Secondary | ICD-10-CM | POA: Diagnosis not present

## 2020-05-25 DIAGNOSIS — M8580 Other specified disorders of bone density and structure, unspecified site: Secondary | ICD-10-CM | POA: Diagnosis not present

## 2020-05-25 DIAGNOSIS — E7800 Pure hypercholesterolemia, unspecified: Secondary | ICD-10-CM | POA: Diagnosis not present

## 2020-05-25 DIAGNOSIS — M109 Gout, unspecified: Secondary | ICD-10-CM | POA: Diagnosis not present

## 2020-05-25 DIAGNOSIS — N1831 Chronic kidney disease, stage 3a: Secondary | ICD-10-CM | POA: Diagnosis not present

## 2020-05-25 DIAGNOSIS — I129 Hypertensive chronic kidney disease with stage 1 through stage 4 chronic kidney disease, or unspecified chronic kidney disease: Secondary | ICD-10-CM | POA: Diagnosis not present

## 2020-06-04 DIAGNOSIS — E790 Hyperuricemia without signs of inflammatory arthritis and tophaceous disease: Secondary | ICD-10-CM | POA: Diagnosis not present

## 2020-06-04 DIAGNOSIS — E7800 Pure hypercholesterolemia, unspecified: Secondary | ICD-10-CM | POA: Diagnosis not present

## 2020-06-04 DIAGNOSIS — I10 Essential (primary) hypertension: Secondary | ICD-10-CM | POA: Diagnosis not present

## 2020-06-04 DIAGNOSIS — I480 Paroxysmal atrial fibrillation: Secondary | ICD-10-CM | POA: Diagnosis not present

## 2020-06-04 DIAGNOSIS — N289 Disorder of kidney and ureter, unspecified: Secondary | ICD-10-CM | POA: Diagnosis not present

## 2020-06-04 DIAGNOSIS — I4821 Permanent atrial fibrillation: Secondary | ICD-10-CM | POA: Diagnosis not present

## 2020-06-19 DIAGNOSIS — I480 Paroxysmal atrial fibrillation: Secondary | ICD-10-CM | POA: Diagnosis not present

## 2020-06-26 DIAGNOSIS — E7800 Pure hypercholesterolemia, unspecified: Secondary | ICD-10-CM | POA: Diagnosis not present

## 2020-06-26 DIAGNOSIS — I4821 Permanent atrial fibrillation: Secondary | ICD-10-CM | POA: Diagnosis not present

## 2020-06-26 DIAGNOSIS — I10 Essential (primary) hypertension: Secondary | ICD-10-CM | POA: Diagnosis not present

## 2020-10-23 DIAGNOSIS — I10 Essential (primary) hypertension: Secondary | ICD-10-CM | POA: Diagnosis not present

## 2020-10-23 DIAGNOSIS — E7800 Pure hypercholesterolemia, unspecified: Secondary | ICD-10-CM | POA: Diagnosis not present

## 2020-10-23 DIAGNOSIS — I4821 Permanent atrial fibrillation: Secondary | ICD-10-CM | POA: Diagnosis not present

## 2020-12-17 ENCOUNTER — Other Ambulatory Visit: Payer: Self-pay | Admitting: Family Medicine

## 2020-12-17 DIAGNOSIS — M8580 Other specified disorders of bone density and structure, unspecified site: Secondary | ICD-10-CM | POA: Diagnosis not present

## 2020-12-17 DIAGNOSIS — N1831 Chronic kidney disease, stage 3a: Secondary | ICD-10-CM | POA: Diagnosis not present

## 2020-12-17 DIAGNOSIS — I4891 Unspecified atrial fibrillation: Secondary | ICD-10-CM | POA: Diagnosis not present

## 2020-12-17 DIAGNOSIS — M109 Gout, unspecified: Secondary | ICD-10-CM | POA: Diagnosis not present

## 2020-12-17 DIAGNOSIS — E7800 Pure hypercholesterolemia, unspecified: Secondary | ICD-10-CM | POA: Diagnosis not present

## 2020-12-17 DIAGNOSIS — Z0000 Encounter for general adult medical examination without abnormal findings: Secondary | ICD-10-CM | POA: Diagnosis not present

## 2020-12-17 DIAGNOSIS — Z1389 Encounter for screening for other disorder: Secondary | ICD-10-CM | POA: Diagnosis not present

## 2020-12-17 DIAGNOSIS — Z1231 Encounter for screening mammogram for malignant neoplasm of breast: Secondary | ICD-10-CM

## 2020-12-17 DIAGNOSIS — I129 Hypertensive chronic kidney disease with stage 1 through stage 4 chronic kidney disease, or unspecified chronic kidney disease: Secondary | ICD-10-CM | POA: Diagnosis not present

## 2020-12-17 DIAGNOSIS — F419 Anxiety disorder, unspecified: Secondary | ICD-10-CM | POA: Diagnosis not present

## 2021-01-16 DIAGNOSIS — H2513 Age-related nuclear cataract, bilateral: Secondary | ICD-10-CM | POA: Diagnosis not present

## 2021-01-16 DIAGNOSIS — H04129 Dry eye syndrome of unspecified lacrimal gland: Secondary | ICD-10-CM | POA: Diagnosis not present

## 2021-01-17 DIAGNOSIS — J069 Acute upper respiratory infection, unspecified: Secondary | ICD-10-CM | POA: Diagnosis not present

## 2021-03-22 ENCOUNTER — Other Ambulatory Visit: Payer: Self-pay

## 2021-03-22 ENCOUNTER — Ambulatory Visit
Admission: RE | Admit: 2021-03-22 | Discharge: 2021-03-22 | Disposition: A | Discharge status: Home / Self Care | Payer: Medicare HMO | Source: Ambulatory Visit | Attending: Family Medicine | Admitting: Family Medicine

## 2021-03-22 DIAGNOSIS — Z1231 Encounter for screening mammogram for malignant neoplasm of breast: Secondary | ICD-10-CM | POA: Diagnosis not present

## 2021-03-25 DIAGNOSIS — K5909 Other constipation: Secondary | ICD-10-CM | POA: Diagnosis not present

## 2021-03-25 DIAGNOSIS — Z7901 Long term (current) use of anticoagulants: Secondary | ICD-10-CM | POA: Diagnosis not present

## 2021-03-25 DIAGNOSIS — Z1211 Encounter for screening for malignant neoplasm of colon: Secondary | ICD-10-CM | POA: Diagnosis not present

## 2021-03-26 DIAGNOSIS — M8588 Other specified disorders of bone density and structure, other site: Secondary | ICD-10-CM | POA: Diagnosis not present

## 2021-04-23 DIAGNOSIS — E7800 Pure hypercholesterolemia, unspecified: Secondary | ICD-10-CM | POA: Diagnosis not present

## 2021-04-23 DIAGNOSIS — I4821 Permanent atrial fibrillation: Secondary | ICD-10-CM | POA: Diagnosis not present

## 2021-04-23 DIAGNOSIS — I10 Essential (primary) hypertension: Secondary | ICD-10-CM | POA: Diagnosis not present

## 2021-05-09 DIAGNOSIS — I480 Paroxysmal atrial fibrillation: Secondary | ICD-10-CM | POA: Diagnosis not present

## 2021-05-15 ENCOUNTER — Emergency Department
Admission: EM | Admit: 2021-05-15 | Discharge: 2021-05-15 | Disposition: A | Discharge status: Home / Self Care | Payer: Medicare HMO | Attending: Emergency Medicine | Admitting: Emergency Medicine

## 2021-05-15 ENCOUNTER — Emergency Department: Payer: Medicare HMO

## 2021-05-15 DIAGNOSIS — M4312 Spondylolisthesis, cervical region: Secondary | ICD-10-CM | POA: Diagnosis not present

## 2021-05-15 DIAGNOSIS — I6782 Cerebral ischemia: Secondary | ICD-10-CM | POA: Diagnosis not present

## 2021-05-15 DIAGNOSIS — R55 Syncope and collapse: Secondary | ICD-10-CM | POA: Insufficient documentation

## 2021-05-15 DIAGNOSIS — S59901A Unspecified injury of right elbow, initial encounter: Secondary | ICD-10-CM | POA: Diagnosis not present

## 2021-05-15 DIAGNOSIS — I480 Paroxysmal atrial fibrillation: Secondary | ICD-10-CM | POA: Diagnosis not present

## 2021-05-15 DIAGNOSIS — M47812 Spondylosis without myelopathy or radiculopathy, cervical region: Secondary | ICD-10-CM | POA: Diagnosis not present

## 2021-05-15 DIAGNOSIS — Z7902 Long term (current) use of antithrombotics/antiplatelets: Secondary | ICD-10-CM | POA: Insufficient documentation

## 2021-05-15 DIAGNOSIS — M10031 Idiopathic gout, right wrist: Secondary | ICD-10-CM | POA: Diagnosis not present

## 2021-05-15 DIAGNOSIS — Z043 Encounter for examination and observation following other accident: Secondary | ICD-10-CM | POA: Diagnosis not present

## 2021-05-15 DIAGNOSIS — S0990XA Unspecified injury of head, initial encounter: Secondary | ICD-10-CM | POA: Diagnosis not present

## 2021-05-15 DIAGNOSIS — D689 Coagulation defect, unspecified: Secondary | ICD-10-CM | POA: Diagnosis not present

## 2021-05-15 LAB — URINALYSIS, ROUTINE W REFLEX MICROSCOPIC
Bacteria, UA: NONE SEEN
Bilirubin Urine: NEGATIVE
Glucose, UA: NEGATIVE mg/dL
Hgb urine dipstick: NEGATIVE
Ketones, ur: NEGATIVE mg/dL
Nitrite: NEGATIVE
Protein, ur: NEGATIVE mg/dL
Specific Gravity, Urine: 1.019 (ref 1.005–1.030)
Squamous Epithelial / HPF: NONE SEEN (ref 0–5)
WBC, UA: NONE SEEN WBC/hpf (ref 0–5)
pH: 5 (ref 5.0–8.0)

## 2021-05-15 LAB — CBC
HCT: 41.3 % (ref 36.0–46.0)
Hemoglobin: 13.5 g/dL (ref 12.0–15.0)
MCH: 28 pg (ref 26.0–34.0)
MCHC: 32.7 g/dL (ref 30.0–36.0)
MCV: 85.5 fL (ref 80.0–100.0)
Platelets: 225 10*3/uL (ref 150–400)
RBC: 4.83 MIL/uL (ref 3.87–5.11)
RDW: 14.6 % (ref 11.5–15.5)
WBC: 7.8 10*3/uL (ref 4.0–10.5)
nRBC: 0 % (ref 0.0–0.2)

## 2021-05-15 LAB — BASIC METABOLIC PANEL
Anion gap: 11 (ref 5–15)
BUN: 19 mg/dL (ref 8–23)
CO2: 27 mmol/L (ref 22–32)
Calcium: 10.2 mg/dL (ref 8.9–10.3)
Chloride: 101 mmol/L (ref 98–111)
Creatinine, Ser: 1.18 mg/dL — ABNORMAL HIGH (ref 0.44–1.00)
GFR, Estimated: 48 mL/min — ABNORMAL LOW (ref >60)
Glucose, Bld: 108 mg/dL — ABNORMAL HIGH (ref 70–99)
Potassium: 3.3 mmol/L — ABNORMAL LOW (ref 3.5–5.1)
Sodium: 139 mmol/L (ref 135–145)

## 2021-05-15 LAB — URIC ACID: Uric Acid, Serum: 6.6 mg/dL (ref 2.5–7.1)

## 2021-05-15 MED ORDER — PREDNISONE 10 MG (21) PO TBPK: ORAL_TABLET | ORAL | 0 refills | Status: DC

## 2021-05-15 NOTE — ED Provider Notes (Signed)
? ?[facility] ?Provider Note ? ? Event Date/Time  ? First MD Initiated Contact with Patient 05/15/21 1244   ?  (approximate) ?History  ?Loss of Consciousness ? ?HPI ?Robin Allen is a 76 y.o. female with a history of paroxysmal atrial fibrillation and recurrent gout who presents for an episode of syncope that occurred yesterday morning after waking up and feeling lightheaded before waking up on the ground.  Patient states that she has had similar episodes in the past due to her paroxysmal atrial fibrillation.  Patient also concerned as she has been having significant right wrist pain in the thumb MTP joint that she states she has had gout in before.  Patient denies any subsequent symptoms of lightheadedness or dizziness.  Patient does endorse striking her head and using warfarin.  Patient denies any subsequent loss of consciousness. ?Physical Exam  ?Triage Vital Signs: ?ED Triage Vitals  ?Enc Vitals Group  ?   BP 05/15/21 1122 118/72  ?   Pulse Rate 05/15/21 1122 75  ?   Resp 05/15/21 1122 18  ?   Temp 05/15/21 1122 97.6 ?F (36.4 ?C)  ?   Temp Source 05/15/21 1122 Oral  ?   SpO2 05/15/21 1122 97 %  ?   Weight 05/15/21 1123 188 lb (85.3 kg)  ?   Height --   ?   Head Circumference --   ?   Peak Flow --   ?   Pain Score 05/15/21 1123 0  ?   Pain Loc --   ?   Pain Edu? --   ?   Excl. in [place]? --   ? ?Most recent vital signs: ?Vitals:  ? 05/15/21 1122  ?BP: 118/72  ?Pulse: 75  ?Resp: 18  ?Temp: 97.6 ?F (36.4 ?C)  ?SpO2: 97%  ? ?General: Awake, oriented x4. ?CV:  Good peripheral perfusion.  ?Resp:  Normal effort.  ?Abd:  No distention.  ?Other:  Elderly Caucasian female laying in bed in no distress. ?ED Results / Procedures / Treatments  ?Labs ?(all labs ordered are listed, but only abnormal results are displayed) ?Labs Reviewed  ?BASIC METABOLIC PANEL - Abnormal; Notable for the following components:  ?    Result Value  ? Potassium 3.3 (*)   ? Glucose, Bld 108 (*)   ? Creatinine, Ser 1.18 (*)   ?  GFR, Estimated 48 (*)   ? All other components within normal limits  ?URINALYSIS, ROUTINE W REFLEX MICROSCOPIC - Abnormal; Notable for the following components:  ? Color, Urine YELLOW (*)   ? APPearance HAZY (*)   ? Leukocytes,Ua TRACE (*)   ? All other components within normal limits  ?CBC  ?URIC ACID  ?CBG MONITORING, ED  ? ?EKG ?ED ECG REPORT ?I, Naaman Plummer, the attending physician, personally viewed and interpreted this ECG. ?Date: 05/15/2021 ?EKG Time: 1127 ?Rate: 80 ?Rhythm: Atrial fibrillation ?QRS Axis: normal ?Intervals: Left bundle branch block ?ST/T Wave abnormalities: normal ?Narrative Interpretation: Atrial fibrillation with LBBB.  No evidence of acute ischemia ?RADIOLOGY ?ED MD interpretation: X-ray of the right elbow interpreted by me and shows no evidence of fractures or dislocations ? 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 ?CT of the head without contrast interpreted by me shows no evidence of acute abnormalities including no intracerebral hemorrhage, obvious masses, or significant edema ? ?CT of the cervical  spine interpreted by me does not show any evidence of acute abnormalities including no acute fracture, malalignment, height loss, or dislocation ?-Agree with radiology assessment ?Official radiology report(s): ?DG Elbow Complete Right ? ?Result Date: 05/15/2021 ?CLINICAL DATA:  Fall hitting left elbow yesterday. EXAM: RIGHT ELBOW - COMPLETE 3+ VIEW COMPARISON:  None Available. FINDINGS: Normal bone mineralization. No elbow joint effusion. No acute fracture is seen. No dislocation. Minimal peripheral medial elbow degenerative spurring. IMPRESSION: No acute fracture. Electronically Signed   By: Yvonne Kendall M.D.   On: 05/15/2021 12:08  ? ?DG Wrist Complete Right ? ?Result Date: 05/15/2021 ?CLINICAL DATA:  Fall.  Single-vessel flow yesterday.  EXAM: RIGHT WRIST - COMPLETE 3+ VIEW COMPARISON:  None Available. FINDINGS: There is minimal 1-2 mm ulnar positive variance. Mild-to-moderate degenerative cystic changes within the medial aspect of the lunate. Moderate thumb carpometacarpal joint space narrowing, subchondral cystic change and peripheral osteophytosis. No acute fracture is seen. No dislocation. IMPRESSION:: IMPRESSION: 1. No acute fracture is seen. 2. 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 Electronically Signed   By: Yvonne Kendall M.D.   On: 05/15/2021 12:07  ? ?CT HEAD WO CONTRAST ? ?Result Date: 05/15/2021 ?CLINICAL DATA:  Head trauma, coagulopathy. EXAM: CT HEAD WITHOUT CONTRAST CT CERVICAL SPINE WITHOUT CONTRAST TECHNIQUE: Multidetector CT imaging of the head and cervical spine was performed following the standard protocol without intravenous contrast. Multiplanar CT image reconstructions of the cervical spine were also generated. RADIATION DOSE REDUCTION: This exam was performed according to the departmental dose-optimization program which includes automated exposure control, adjustment of the mA and/or kV according to patient size and/or use of iterative reconstruction technique. COMPARISON:  Head CT 01/22/2007. FINDINGS: CT HEAD FINDINGS Brain: Mild generalized parenchymal atrophy. Mild patchy and ill-defined hypoattenuation within the cerebral white matter, nonspecific but compatible with chronic small vessel ischemic disease. There is no acute intracranial hemorrhage. No demarcated cortical infarct. No extra-axial fluid collection. No evidence of an intracranial mass. No midline shift. Vascular: No hyperdense vessel.  Atherosclerotic calcifications. Skull: Normal. Negative for fracture or focal lesion. Sinuses/Orbits: No mass or acute finding within the imaged orbits. No significant paranasal sinus disease at the imaged levels. CT CERVICAL SPINE FINDINGS Alignment: Reversal of the expected cervical lordosis.  Trace grade 1 anterolisthesis at C3-C4 and C4-C5. Skull base and vertebrae: The basion-dental and atlanto-dental intervals are maintained.No evidence of acute fracture to the cervical spine. Soft tissues and spinal canal: No prevertebral fluid or swelling. No visible canal hematoma. Disc levels: Cervical spondylosis with multilevel disc space narrowing, disc bulges, posterior disc osteophytes, endplate spurring, uncovertebral hypertrophy and facet arthrosis. Disc space narrowing is greatest at C5-C6 and C6-C7 (advanced at these levels). No appreciable high-grade spinal canal stenosis. Multilevel bony neural foraminal narrowing. Degenerative changes are also present at the C1-C2 articulation. Upper chest: No consolidation within the imaged lung apices. No visible pneumothorax. IMPRESSION: CT head: 1. No evidence of acute intracranial abnormality. 2. Mild chronic small vessel ischemic changes within the cerebral white matter. 3. Mild generalized parenchymal atrophy. CT cervical spine: 1. No evidence of acute fracture to the cervical spine. 2. Nonspecific reversal of the expected cervical lordosis. 3. Trace grade 1 anterolisthesis at C3-C4 and C4-C5. 4. Cervical spondylosis, as described. Electronically Signed   By: Kellie Simmering D.O.   On: 05/15/2021 12:03  ? ?CT Cervical Spine Wo Contrast ? ?Result Date: 05/15/2021 ?CLINICAL DATA:  Head trauma, coagulopathy. EXAM: CT HEAD WITHOUT CONTRAST CT CERVICAL SPINE WITHOUT CONTRAST TECHNIQUE: Multidetector CT imaging of  the head and cervical spine was performed following the standard protocol without intravenous contrast. Multiplanar CT image reconstructions of the cervical spine were also generated. RADIATION DOSE REDUCTION: This exam was performed according to the departmental dose-optimization program which includes automated exposure control, adjustment of the mA and/or kV according to patient size and/or use of iterative reconstruction technique. COMPARISON:  Head CT  01/22/2007. FINDINGS: CT HEAD FINDINGS Brain: Mild generalized parenchymal atrophy. Mild patchy and ill-defined hypoattenuation within the cerebral white matter, nonspecific but compatible with chronic small v

## 2021-05-15 NOTE — ED Notes (Signed)
Pt and daughter give verbal consent to Dc and pt refused wheelchair.  ?

## 2021-05-15 NOTE — ED Triage Notes (Signed)
Pt comes pov after syncopal episode yesterday. States she wasn't feeling good and her right wrist has been hurting. Has gout and appears as a gout flair up. States she got up from bed yesterday and was walking around not feeling well when she suddenly felt like she was going to pass out and then she woke up on the floor. Pt states her hand is better than yesterday but some redness and warmth to it. When she fell, hit her right elbow. Does take warfarin.  ?

## 2021-05-21 DIAGNOSIS — R55 Syncope and collapse: Secondary | ICD-10-CM | POA: Insufficient documentation

## 2021-05-21 NOTE — Progress Notes (Signed)
Cardiology Office Note ? ?Date:  05/22/2021  ? ?ID:  Robin Allen, DOB [birth year], MRN [id number] ? ?PCP:  Madelyn Brunner, MD  ? ?Chief Complaint  ?Patient presents with  ? New Patient (Initial Visit)  ?  Referred by ED -- Meds reviewed verbally with patient.   ? ? ?HPI:  ?Ms. Robin Allen is a 76 year old woman with past medical history of ?Atrial fibrillation, permenent ?Smoker, quit 26 years ago ?Hyperlipidemia ?Presenting to the emergency room May 15, 2021 with syncope ? ?On May 15, 2021, at 5 am got out of bed, went to get water ?Reports that she felt poorly, feels that was from gout ?Came back to bed, next thing she remembers was on the floor ?Hit head and elbow ? ? occurred in the morning after waking up, felt lightheaded, woke up on the ground.  Patient states that she has had similar episodes in the past due to her paroxysmal atrial fibrillation. ? ?Blood pressure in the ER 118/72 heart rate 75 ?EKG in the ER showing atrial fibrillation rate 80 bpm ? ?EKG May 2022 noted to be in atrial fibrillation ?EKG August 2018 in 2017 atrial fibrillation ? ?Echo June 2022 noted to have irregular rhythm throughout exam ?moderate enlarged left atrium ?Normal ejection fraction ?Moderate TR ? ?EKG personally reviewed by myself on todays visit ?Atrial fibrillation rate 107 bpm, RBBB ? ? ?PMH:   has a past medical history of Atrial fibrillation ([place]), Coronary artery disease, Hyperlipidemia, and Hypertension. ? ?PSH:    ?Past Surgical History:  ?Procedure Laterality Date  ? ABDOMINAL HYSTERECTOMY  1995  ? Total, lower midline incision  ? BREAST BIOPSY Right 2016  ? neg  ? CHOLECYSTECTOMY N/A 12/16/2015  ? Procedure: LAPAROSCOPIC CHOLECYSTECTOMY  with cholangiogram;  Surgeon: Olean Ree, MD;  Location: [facility] ORS;  Service: General;  Laterality: N/A;  ? ? ?Current Outpatient Medications  ?Medication Sig Dispense Refill  ? allopurinol (ZYLOPRIM) 100 MG tablet TAKE 1 TABLET BY MOUTH ONCE DAILY    ? aspirin EC 81 MG tablet  Take 81 mg by mouth daily.    ? dicyclomine (BENTYL) 20 MG tablet     ? FLUZONE HIGH-DOSE 0.5 ML SUSY     ? Garlic 509 MG TABS Take by mouth.    ? metoprolol (LOPRESSOR) 50 MG tablet TAKE 1 TABLET BY MOUTH TWICE DAILY.    ? ondansetron (ZOFRAN) 4 MG tablet Take 1 tablet (4 mg total) by mouth every 8 (eight) hours as needed for nausea or vomiting. 21 tablet 0  ? oxyCODONE (OXY IR/ROXICODONE) 5 MG immediate release tablet Take 1-2 tablets (5-10 mg total) by mouth every 4 (four) hours as needed for moderate pain or severe pain. 30 tablet 0  ? triamterene-hydrochlorothiazide (DYAZIDE) 37.5-25 MG capsule TAKE 1 CAPSULE BY MOUTH ONCE EVERY MORNING    ? warfarin (COUMADIN) 5 MG tablet Take 5 mg by mouth daily.    ? ?No current facility-administered medications for this visit.  ? ? ? ?Allergies:   Augmentin [amoxicillin-pot clavulanate]  ? ?Social History:  The patient  reports that she has quit smoking. She has a 3.00 pack-year smoking history. She has never used smokeless tobacco. She reports that she does not drink alcohol and does not use drugs.  ? ?Family History:   family history includes Basal cell carcinoma in her mother; Hypertension in her father and mother; Melanoma in her father.  ? ? ?Review of Systems: ?Review of Systems  ?Constitutional: Negative.   ?HENT:  Negative.    ?Respiratory: Negative.    ?Cardiovascular: Negative.   ?Gastrointestinal: Negative.   ?Musculoskeletal: Negative.   ?Neurological:  Positive for loss of consciousness.  ?Psychiatric/Behavioral: Negative.    ?All other systems reviewed and are negative. ? ? ?PHYSICAL EXAM: ?VS:  BP 110/62 (BP Location: Left Arm, Patient Position: Sitting, Cuff Size: Normal)   Pulse (!) 107   Ht '5\' 5"'$  (1.651 m)   Wt 187 lb (84.8 kg)   SpO2 97%   BMI 31.12 kg/m?  , BMI Body mass index is 31.12 kg/m?. ?GEN: Well nourished, well developed, in no acute distress ?HEENT: normal ?Neck: no JVD, carotid bruits, or masses ?Cardiac: Irregularly irregular, no murmurs,  rubs, or gallops,no edema  ?Respiratory:  clear to auscultation bilaterally, normal work of breathing ?GI: soft, nontender, nondistended, + BS ?MS: no deformity or atrophy ?Skin: warm and dry, no rash ?Neuro:  Strength and sensation are intact ?Psych: euthymic mood, full affect ? ?Recent Labs: ?05/15/2021: BUN 19; Creatinine, Ser 1.18; Hemoglobin 13.5; Platelets 225; Potassium 3.3; Sodium 139  ? ? ?Lipid Panel ?No results found for: CHOL, HDL, LDLCALC, TRIG ?  ? ?Wt Readings from Last 3 Encounters:  ?05/22/21 187 lb (84.8 kg)  ?05/15/21 188 lb (85.3 kg)  ?12/31/15 178 lb (80.7 kg)  ?  ? ? ? ?ASSESSMENT AND PLAN: ? ?Problem List Items Addressed This Visit   ? ?  ? Cardiology Problems  ? Atrial fibrillation ([place]) - Primary  ? Relevant Medications  ? warfarin (COUMADIN) 5 MG tablet  ?  ? Other  ? Syncope and collapse  ? ?Syncope ?Etiology unclear though concerning for possible orthostasis ?Reports feeling terrible in the setting of gout, unable to exclude vasovagal etiology ?Blood pressure running low, heart rate elevated on today's visit ?Potassium also low ?Recommend she hold the triamterene HCTZ ?Higher dose metoprolol as detailed below ?Recommend she sit on the side of the bed before getting up too quickly ? ?Atrial fibrillation, permanent ?Rate elevated on today's visit ?Recommend she increase metoprolol to tartrate up to 75 twice daily ?Reports compliance with her warfarin ?Most recent INR 2.2, in general has been well controlled ?Appears relatively euvolemic on exam ?Previously on Eliquis, changed in April 2023 to warfarin for cost reasons ? ?Hyperlipidemia ?Continue Crestor ? ?Essential hypertension ?Recent syncope, we will stop the triamterene HCTZ for various reasons including concern for hypovolemia, hypokalemia ?Increase metoprolol as above for better rate control ? ?Recommend she call us with heart rate and blood pressure measurements in the next week or so ? ? Total encounter time more than 60 minutes ?  Greater than 50% was spent in counseling and coordination of care with the patient ? ? ? ?Signed, ?Esmond Plants, M.D., Ph.D. ?[facility] [place], [place] ?[phone] ?

## 2021-05-22 ENCOUNTER — Encounter: Payer: Self-pay | Admitting: Cardiovascular Disease

## 2021-05-22 ENCOUNTER — Ambulatory Visit: Payer: Medicare HMO | Admitting: Cardiovascular Disease

## 2021-05-22 VITALS — BP 110/62 | HR 107 | Ht 65.0 in | Wt 187.0 lb

## 2021-05-22 DIAGNOSIS — R55 Syncope and collapse: Secondary | ICD-10-CM | POA: Diagnosis not present

## 2021-05-22 DIAGNOSIS — I4819 Other persistent atrial fibrillation: Secondary | ICD-10-CM | POA: Diagnosis not present

## 2021-05-22 MED ORDER — METOPROLOL TARTRATE 50 MG PO TABS: 75.0000 mg | ORAL_TABLET | Freq: Two times a day (BID) | ORAL | 3 refills | Status: AC

## 2021-05-22 NOTE — Patient Instructions (Addendum)
Medication Instructions:  ?Hold the triamterene HCTZ ?Please increase the metoprolol tartrate up to 75 mg twice a day (1.5 pills twice a day) ? ?If you need a refill on your cardiac medications before your next appointment, please call your pharmacy.  ? ?Lab work: ?No new labs needed ? ?Testing/Procedures: ?No new testing needed ? ?Follow-Up: ?At [facility], you and your health needs are our priority.  As part of our continuing mission to provide you with exceptional heart care, we have created designated Provider Care Teams.  These Care Teams include your primary Cardiologist (physician) and Advanced Practice Providers (APPs -  Physician Assistants and Nurse Practitioners) who all work together to provide you with the care you need, when you need it. ? ?You will need a follow up appointment in 3 months ? ?Providers on your designated Care Team:   ?Murray Hodgkins, NP ?Christell Faith, PA-C ?Cadence Kathlen Mody, PA-C ? ?COVID-19 Vaccine Information can be found at: [email or web address].co.uk For questions related to vaccine distribution or appointments, please email [email or web address] or call [phone].  ? ?

## 2021-05-23 ENCOUNTER — Emergency Department: Payer: Medicare HMO

## 2021-05-23 ENCOUNTER — Emergency Department
Admission: EM | Admit: 2021-05-23 | Discharge: 2021-05-24 | Disposition: A | Discharge status: Home / Self Care | Payer: Medicare HMO | Attending: Emergency Medicine | Admitting: Emergency Medicine

## 2021-05-23 ENCOUNTER — Other Ambulatory Visit: Payer: Self-pay

## 2021-05-23 ENCOUNTER — Ambulatory Visit
Admission: EM | Admit: 2021-05-23 | Discharge: 2021-05-23 | Disposition: A | Discharge status: Home / Self Care | Payer: Medicare HMO

## 2021-05-23 DIAGNOSIS — M26603 Bilateral temporomandibular joint disorder, unspecified: Secondary | ICD-10-CM | POA: Diagnosis not present

## 2021-05-23 DIAGNOSIS — M26602 Left temporomandibular joint disorder, unspecified: Secondary | ICD-10-CM | POA: Diagnosis not present

## 2021-05-23 DIAGNOSIS — I10 Essential (primary) hypertension: Secondary | ICD-10-CM | POA: Diagnosis not present

## 2021-05-23 DIAGNOSIS — R6884 Jaw pain: Secondary | ICD-10-CM | POA: Diagnosis not present

## 2021-05-23 DIAGNOSIS — I2510 Atherosclerotic heart disease of native coronary artery without angina pectoris: Secondary | ICD-10-CM | POA: Insufficient documentation

## 2021-05-23 DIAGNOSIS — M26622 Arthralgia of left temporomandibular joint: Secondary | ICD-10-CM | POA: Diagnosis not present

## 2021-05-23 DIAGNOSIS — M26609 Unspecified temporomandibular joint disorder, unspecified side: Secondary | ICD-10-CM

## 2021-05-23 LAB — BASIC METABOLIC PANEL
Anion gap: 5 (ref 5–15)
BUN: 25 mg/dL — ABNORMAL HIGH (ref 8–23)
CO2: 29 mmol/L (ref 22–32)
Calcium: 9.6 mg/dL (ref 8.9–10.3)
Chloride: 99 mmol/L (ref 98–111)
Creatinine, Ser: 1.05 mg/dL — ABNORMAL HIGH (ref 0.44–1.00)
GFR, Estimated: 55 mL/min — ABNORMAL LOW (ref >60)
Glucose, Bld: 112 mg/dL — ABNORMAL HIGH (ref 70–99)
Potassium: 2.9 mmol/L — ABNORMAL LOW (ref 3.5–5.1)
Sodium: 133 mmol/L — ABNORMAL LOW (ref 135–145)

## 2021-05-23 LAB — CBC WITH DIFFERENTIAL/PLATELET
Abs Immature Granulocytes: 0.05 10*3/uL (ref 0.00–0.07)
Basophils Absolute: 0 10*3/uL (ref 0.0–0.1)
Basophils Relative: 0 %
Eosinophils Absolute: 0.1 10*3/uL (ref 0.0–0.5)
Eosinophils Relative: 1 %
HCT: 41.2 % (ref 36.0–46.0)
Hemoglobin: 13.8 g/dL (ref 12.0–15.0)
Immature Granulocytes: 0 %
Lymphocytes Relative: 18 %
Lymphs Abs: 2.3 10*3/uL (ref 0.7–4.0)
MCH: 28.5 pg (ref 26.0–34.0)
MCHC: 33.5 g/dL (ref 30.0–36.0)
MCV: 84.9 fL (ref 80.0–100.0)
Monocytes Absolute: 1 10*3/uL (ref 0.1–1.0)
Monocytes Relative: 8 %
Neutro Abs: 9.2 10*3/uL — ABNORMAL HIGH (ref 1.7–7.7)
Neutrophils Relative %: 73 %
Platelets: 205 10*3/uL (ref 150–400)
RBC: 4.85 MIL/uL (ref 3.87–5.11)
RDW: 14.7 % (ref 11.5–15.5)
WBC: 12.6 10*3/uL — ABNORMAL HIGH (ref 4.0–10.5)
nRBC: 0 % (ref 0.0–0.2)

## 2021-05-23 MED ORDER — POTASSIUM CHLORIDE 20 MEQ PO PACK
40.0000 meq | PACK | Freq: Once | ORAL | Status: AC
  Administered 2021-05-23: 40 meq via ORAL
  Filled 2021-05-23: qty 2

## 2021-05-23 MED ORDER — OXYCODONE-ACETAMINOPHEN 5-325 MG PO TABS
1.0000 | ORAL_TABLET | Freq: Once | ORAL | Status: AC
  Administered 2021-05-23: 1 via ORAL
  Filled 2021-05-23: qty 1

## 2021-05-23 MED ORDER — OXYCODONE-ACETAMINOPHEN 5-325 MG PO TABS: 1.0000 | ORAL_TABLET | Freq: Four times a day (QID) | ORAL | 0 refills | Status: AC | PRN

## 2021-05-23 NOTE — ED Triage Notes (Signed)
Patient presents to Urgent Care with complaints of left jaw pain since Tuesday. Daughter states she is unsure if she popped her jaw. Treating pain with Tylenol. Last dose at 1700. Daughter states she had a severe episode of gout and was seen in 05/15/21.  ? ?Denies SOB, chest pain, fever, or n/v.  ?

## 2021-05-23 NOTE — ED Notes (Signed)
-

## 2021-05-23 NOTE — ED Notes (Signed)
Pt in CT, to be medicated once back in the ED. ?

## 2021-05-23 NOTE — ED Provider Notes (Signed)
? ?[facility] ?Provider Note ? ? ? Event Date/Time  ? First MD Initiated Contact with Patient 05/23/21 2222   ?  (approximate) ? ? ?History  ? ?Jaw Pain ? ? ?HPI ? ?Robin Allen is a 76 y.o. female  who, per cardiology note dated yesterday has history of CAD, HLD, HTN, afib, who presents to the emergency department today because of concerns for left jaw pain.  The pain started 2 days ago.  It is severe.  It is present all the time but worse with attempting to chew.  The patient denies any trauma, however did feel a popping sensation to the left jaw prior to the pain starting.  Denies similar symptoms in the past. ? ?Physical Exam  ? ?Triage Vital Signs: ?ED Triage Vitals  ?Enc Vitals Group  ?   BP 05/23/21 1908 116/69  ?   Pulse Rate 05/23/21 1908 86  ?   Resp 05/23/21 1908 16  ?   Temp 05/23/21 1908 98.5 ?F (36.9 ?C)  ?   Temp Source 05/23/21 1908 Oral  ?   SpO2 05/23/21 1908 96 %  ?   Weight 05/23/21 1909 186 lb 15.2 oz (84.8 kg)  ?   Height 05/23/21 1909 '5\' 5"'$  (1.651 m)  ?   Head Circumference --   ?   Peak Flow --   ?   Pain Score 05/23/21 1908 7  ? ?Most recent vital signs: ?Vitals:  ? 05/23/21 1908  ?BP: 116/69  ?Pulse: 86  ?Resp: 16  ?Temp: 98.5 ?F (36.9 ?C)  ?SpO2: 96%  ? ? ?General: Awake, alert, oriented. ?CV:  Good peripheral perfusion.  ?Resp:  Normal effort.  ?Abd:  No distention.  ?Other:  Tender to palpation over the left TMJ. No trismus.  ? ? ?ED Results / Procedures / Treatments  ? ?Labs ?(all labs ordered are listed, but only abnormal results are displayed) ?Labs Reviewed  ?BASIC METABOLIC PANEL - Abnormal; Notable for the following components:  ?    Result Value  ? Sodium 133 (*)   ? Potassium 2.9 (*)   ? Glucose, Bld 112 (*)   ? BUN 25 (*)   ? Creatinine, Ser 1.05 (*)   ? GFR, Estimated 55 (*)   ? All other components within normal limits  ?CBC WITH DIFFERENTIAL/PLATELET - Abnormal; Notable for the following components:  ? WBC 12.6 (*)   ? Neutro Abs 9.2 (*)   ? All  other components within normal limits  ? ? ? ?RADIOLOGY ?I independently interpreted and visualized the ct max face. My interpretation: Left TMJ disease ?Radiology interpretation:  ?IMPRESSION:  ?1. No acute osseous abnormality.  ?2. Left greater than right TMJ disease with mild erosions at the  ?head of the mandible.  ?   ? ? ? ? ?PROCEDURES: ? ?Critical Care performed: No ? ?Procedures ? ? ?MEDICATIONS ORDERED IN ED: ?Medications - No data to display ? ? ?IMPRESSION / MDM / ASSESSMENT AND PLAN / ED COURSE  ?I reviewed the triage vital signs and the nursing notes. ?             ?               ? ?Differential diagnosis includes, but is not limited to, TMJ disease, jaw dislocation, trigeminal neuralgia. ? ?Patient presents to the emergency department today because of concerns for left jaw pain.  On exam she is tender over the left TMJ.  No trismus.  CT was obtained to evaluate for possible fracture or dislocation.  This did show disease of the left TMJ.  At this time I do think that is likely what is causing the patient's pain.  I discussed that with the patient.  We will plan on giving prescription for pain medication and ENT follow-up. ? ? ?FINAL CLINICAL IMPRESSION(S) / ED DIAGNOSES  ? 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 ? ? ? ?Note:  This document was prepared using Dragon voice recognition software and may include unintentional dictation errors. ? ?  ?Nance Pear, MD ?05/23/21 2349 ? ?

## 2021-05-23 NOTE — ED Triage Notes (Signed)
Pt arrives with c/o left sided jaw pain that started 3 days ago. Per pt, the pain woke her out of her sleep and is is painful for her to swallow her food. Pt describes pain as a stabbing sensation. OTC meds have not helped the pain.  ?

## 2021-05-23 NOTE — Discharge Instructions (Signed)
Please seek medical attention for any high fevers, chest pain, shortness of breath, change in behavior, persistent vomiting, bloody stool or any other new or concerning symptoms.  

## 2021-05-28 ENCOUNTER — Encounter: Payer: Self-pay | Admitting: Cardiovascular Disease

## 2021-05-28 ENCOUNTER — Telehealth: Payer: Self-pay | Admitting: Emergency Medicine

## 2021-05-28 DIAGNOSIS — M26642 Arthritis of left temporomandibular joint: Secondary | ICD-10-CM | POA: Diagnosis not present

## 2021-05-28 DIAGNOSIS — I480 Paroxysmal atrial fibrillation: Secondary | ICD-10-CM | POA: Diagnosis not present

## 2021-05-28 DIAGNOSIS — L988 Other specified disorders of the skin and subcutaneous tissue: Secondary | ICD-10-CM | POA: Diagnosis not present

## 2021-05-28 NOTE — Telephone Encounter (Signed)
Called patient per her request in MyChart message.  ? ?Pt reports that she's been having difficulty falling asleep at night due to palpitations. Reports that this started after her last OV and we made medication changes.  ? ?Reports that she has the palpitations off and on during the day, but it's more frustrating at night because she feels more wound up and it makes it hard to go to sleep.  ? ?Advised pt that I will forward this to MD for review and call her back once he responds.  ?

## 2021-05-29 NOTE — Telephone Encounter (Signed)
Robin Merritts, MD   ? ?On last clinic visit triamterene HCTZ was held as blood pressure was low  ?We went up on metoprolol to 75 twice daily for atrial fibrillation with elevated rate  ?Can she let us know what her heart rate and blood pressure is running  ?Perhaps without the blood pressure pill blood pressure is running higher?  ?Thx  ?TGollan   ? ?Called patient to ask what heart rate and BP has been running. Patient reports that she hasn't been checking it but she will and call us with some numbers or send them via MyChart. Pt voiced appreciation for the call back.  ?

## 2021-05-31 ENCOUNTER — Telehealth: Payer: Self-pay | Admitting: Cardiovascular Disease

## 2021-05-31 NOTE — Telephone Encounter (Signed)
Pt would like for nurse to give her a callback regarding call the other day about medication change. Please advise

## 2021-06-03 DIAGNOSIS — I480 Paroxysmal atrial fibrillation: Secondary | ICD-10-CM | POA: Diagnosis not present

## 2021-06-03 NOTE — Telephone Encounter (Signed)
Please see encounter from 5/16. Closing this encounter.

## 2021-06-03 NOTE — Telephone Encounter (Signed)
Separate inbasket received stating: "Pt would like for nurse to give her a callback regarding call the other day about medication change. Please advise"  Called and spoke with patient. Pt reports that she forgot to to get heart rate and blood pressure numbers.   Asked patient how her palpitations have been at night. Pt states that they are gone, and she has no trouble falling asleep at night now. Reports that she falls asleep in her chair then gets up and goes to bed and goes right to sleep.   Pt has no questions or concerns at present and understands that she can call our office at any time.

## 2021-06-14 ENCOUNTER — Encounter
Admission: RE | Disposition: A | Discharge status: Home / Self Care | Payer: Self-pay | Source: Home / Self Care | Attending: Gastroenterology

## 2021-06-14 ENCOUNTER — Ambulatory Visit: Payer: Medicare HMO | Admitting: Anesthesiology

## 2021-06-14 ENCOUNTER — Encounter: Payer: Self-pay | Admitting: *Deleted

## 2021-06-14 ENCOUNTER — Ambulatory Visit
Admission: RE | Admit: 2021-06-14 | Discharge: 2021-06-14 | Disposition: A | Discharge status: Home / Self Care | Payer: Medicare HMO | Attending: Gastroenterology | Admitting: Gastroenterology

## 2021-06-14 DIAGNOSIS — Z90710 Acquired absence of both cervix and uterus: Secondary | ICD-10-CM | POA: Insufficient documentation

## 2021-06-14 DIAGNOSIS — E039 Hypothyroidism, unspecified: Secondary | ICD-10-CM | POA: Insufficient documentation

## 2021-06-14 DIAGNOSIS — Z7901 Long term (current) use of anticoagulants: Secondary | ICD-10-CM | POA: Insufficient documentation

## 2021-06-14 DIAGNOSIS — E785 Hyperlipidemia, unspecified: Secondary | ICD-10-CM | POA: Insufficient documentation

## 2021-06-14 DIAGNOSIS — I2510 Atherosclerotic heart disease of native coronary artery without angina pectoris: Secondary | ICD-10-CM | POA: Diagnosis not present

## 2021-06-14 DIAGNOSIS — N189 Chronic kidney disease, unspecified: Secondary | ICD-10-CM | POA: Diagnosis not present

## 2021-06-14 DIAGNOSIS — Z1211 Encounter for screening for malignant neoplasm of colon: Secondary | ICD-10-CM | POA: Insufficient documentation

## 2021-06-14 DIAGNOSIS — I129 Hypertensive chronic kidney disease with stage 1 through stage 4 chronic kidney disease, or unspecified chronic kidney disease: Secondary | ICD-10-CM | POA: Insufficient documentation

## 2021-06-14 DIAGNOSIS — Z9049 Acquired absence of other specified parts of digestive tract: Secondary | ICD-10-CM | POA: Insufficient documentation

## 2021-06-14 DIAGNOSIS — K649 Unspecified hemorrhoids: Secondary | ICD-10-CM | POA: Diagnosis not present

## 2021-06-14 DIAGNOSIS — I4891 Unspecified atrial fibrillation: Secondary | ICD-10-CM | POA: Diagnosis not present

## 2021-06-14 DIAGNOSIS — I4510 Unspecified right bundle-branch block: Secondary | ICD-10-CM | POA: Insufficient documentation

## 2021-06-14 DIAGNOSIS — K640 First degree hemorrhoids: Secondary | ICD-10-CM | POA: Insufficient documentation

## 2021-06-14 HISTORY — DX: Hypothyroidism, unspecified: E03.9

## 2021-06-14 HISTORY — DX: Gout, unspecified: M10.9

## 2021-06-14 HISTORY — DX: Hyperuricemia without signs of inflammatory arthritis and tophaceous disease: E79.0

## 2021-06-14 HISTORY — DX: Depression, unspecified: F32.A

## 2021-06-14 HISTORY — PX: COLONOSCOPY WITH PROPOFOL: SHX5780

## 2021-06-14 HISTORY — DX: Anemia, unspecified: D64.9

## 2021-06-14 HISTORY — DX: Chronic kidney disease, unspecified: N18.9

## 2021-06-14 SURGERY — COLONOSCOPY WITH PROPOFOL
Anesthesia: General

## 2021-06-14 MED ORDER — SODIUM CHLORIDE 0.9 % IV SOLN
INTRAVENOUS | Status: DC
  Administered 2021-06-14: 1000 mL via INTRAVENOUS

## 2021-06-14 MED ORDER — PROPOFOL 10 MG/ML IV BOLUS
INTRAVENOUS | Status: AC
  Filled 2021-06-14: qty 20

## 2021-06-14 MED ORDER — LIDOCAINE HCL (CARDIAC) PF 100 MG/5ML IV SOSY
PREFILLED_SYRINGE | INTRAVENOUS | Status: DC | PRN
  Administered 2021-06-14: 100 mg via INTRAVENOUS

## 2021-06-14 MED ORDER — LIDOCAINE HCL (PF) 2 % IJ SOLN
INTRAMUSCULAR | Status: AC
  Filled 2021-06-14: qty 5

## 2021-06-14 MED ORDER — PROPOFOL 10 MG/ML IV BOLUS
INTRAVENOUS | Status: DC | PRN
  Administered 2021-06-14: 100 mg via INTRAVENOUS
  Administered 2021-06-14: 100 ug/kg/min via INTRAVENOUS

## 2021-06-14 NOTE — Anesthesia Postprocedure Evaluation (Signed)
Anesthesia Post Note  Patient: Robin Allen  Procedure(s) Performed: COLONOSCOPY WITH PROPOFOL  Patient location during evaluation: PACU Anesthesia Type: General Level of consciousness: awake Pain management: satisfactory to patient Respiratory status: spontaneous breathing and nonlabored ventilation Cardiovascular status: stable Anesthetic complications: no   No notable events documented.   Last Vitals:  Vitals:   06/14/21 1345 06/14/21 1355  BP: 124/76 129/86  Pulse: 76 85  Resp: 20 19  Temp:    SpO2: 97% 96%    Last Pain:  Vitals:   06/14/21 1355  TempSrc:   PainSc: Asleep                 VAN STAVEREN,Peytan Andringa

## 2021-06-14 NOTE — Anesthesia Preprocedure Evaluation (Addendum)
Anesthesia Evaluation  Patient identified by MRN, date of birth, ID band Patient awake    Reviewed: Allergy & Precautions, NPO status , Patient's Chart, lab work & pertinent test results  Airway Mallampati: III  TM Distance: >3 FB Neck ROM: full    Dental  (+) Chipped   Pulmonary neg pulmonary ROS, former smoker,    Pulmonary exam normal        Cardiovascular hypertension, + CAD  negative cardio ROS Normal cardiovascular exam+ dysrhythmias (RBBB) Atrial Fibrillation + Valvular Problems/Murmurs  Rhythm:Irregular  ECHO 6/22: INTERPRETATION  NORMAL LEFT VENTRICULAR SYSTOLIC FUNCTION  WITH MILD LVH  NORMAL RIGHT VENTRICULAR SYSTOLIC FUNCTION  MODERATE VALVULAR REGURGITATION (See above)  NO VALVULAR STENOSIS  ESTIMATED LVEF >55%  MODERATE TRICUSPID REGURGITATION  MILD MITRAL REGURGITATION  MODERATE BI-ATRIAL ENLARGEMENT  IRREGULAR HEART RHYTHM THROUGHOUT EXAM  Tricuspid: MODERATE TR     Neuro/Psych PSYCHIATRIC DISORDERS Depression negative neurological ROS  negative psych ROS   GI/Hepatic negative GI ROS, Neg liver ROS,   Endo/Other  negative endocrine ROSHypothyroidism   Renal/GU CRFRenal diseasenegative Renal ROS  negative genitourinary   Musculoskeletal   Abdominal   Peds  Hematology negative hematology ROS (+) Blood dyscrasia, anemia ,   Anesthesia Other Findings TMJ with recent prednisone use  Hypokalemia  Past Medical History: No date: Anemia No date: Atrial fibrillation (HCC) No date: Chronic kidney disease No date: Coronary artery disease No date: Depression No date: Gout No date: Hyperlipidemia No date: Hypertension No date: Hyperuricemia No date: Hypothyroidism  Past Surgical History: 1995: ABDOMINAL HYSTERECTOMY     Comment:  Total, lower midline incision 2016: BREAST BIOPSY; Right     Comment:  neg 12/16/2015: CHOLECYSTECTOMY; N/A     Comment:  Procedure: LAPAROSCOPIC CHOLECYSTECTOMY   with               cholangiogram;  Surgeon: Olean Ree, MD;  Location:               [facility] ORS;  Service: General;  Laterality: N/A; No date: TONSILLECTOMY     Reproductive/Obstetrics negative OB ROS                            Anesthesia Physical Anesthesia Plan  ASA: 3  Anesthesia Plan: General   Post-op Pain Management:    Induction: Intravenous  PONV Risk Score and Plan: Propofol infusion and TIVA  Airway Management Planned: Natural Airway  Additional Equipment:   Intra-op Plan:   Post-operative Plan:   Informed Consent: I have reviewed the patients History and Physical, chart, labs and discussed the procedure including the risks, benefits and alternatives for the proposed anesthesia with the patient or authorized representative who has indicated his/her understanding and acceptance.     Dental Advisory Given  Plan Discussed with: Anesthesiologist, CRNA and Surgeon  Anesthesia Plan Comments:        Anesthesia Quick Evaluation

## 2021-06-14 NOTE — Op Note (Signed)
[facility] Patient Name: Robin Allen Procedure Date: 06/14/2021 12:25 PM MRN: [id number] Account #: [id number] Date of Birth: 09-Apr-1945 Admit Type: Outpatient Age: 76 Room: [facility] ENDO ROOM 3 Gender: Female Note Status: Finalized Instrument Name: Park Meo [id number] Procedure:             Colonoscopy Indications:           Screening for colorectal malignant neoplasm Providers:             Andrey Farmer MD, MD Medicines:             Monitored Anesthesia Care Complications:         No immediate complications. Procedure:             Pre-Anesthesia Assessment:                        - Prior to the procedure, a History and Physical was                         performed, and patient medications and allergies were                         reviewed. The patient is competent. The risks and                         benefits of the procedure and the sedation options and                         risks were discussed with the patient. All questions                         were answered and informed consent was obtained.                         Patient identification and proposed procedure were                         verified by the physician, the nurse, the                         anesthesiologist, the anesthetist and the technician                         in the endoscopy suite. Mental Status Examination:                         alert and oriented. Airway Examination: normal                         oropharyngeal airway and neck mobility. Respiratory                         Examination: clear to auscultation. CV Examination:                         normal. Prophylactic Antibiotics: The patient does not                         require prophylactic antibiotics. Prior  Anticoagulants: The patient has taken no previous                         anticoagulant or antiplatelet agents. ASA Grade                         Assessment: III - A patient  with severe systemic                         disease. After reviewing the risks and benefits, the                         patient was deemed in satisfactory condition to                         undergo the procedure. The anesthesia plan was to use                         monitored anesthesia care (MAC). Immediately prior to                         administration of medications, the patient was                         re-assessed for adequacy to receive sedatives. The                         heart rate, respiratory rate, oxygen saturations,                         blood pressure, adequacy of pulmonary ventilation, and                         response to care were monitored throughout the                         procedure. The physical status of the patient was                         re-assessed after the procedure.                        After obtaining informed consent, the colonoscope was                         passed under direct vision. Throughout the procedure,                         the patient's blood pressure, pulse, and oxygen                         saturations were monitored continuously. The                         Colonoscope was introduced through the anus and                         advanced to the the cecum, identified by appendiceal  orifice and ileocecal valve. The colonoscopy was                         somewhat difficult due to restricted mobility of the                         colon. The patient tolerated the procedure well. The                         quality of the bowel preparation was good. Findings:      The perianal and digital rectal examinations were normal.      Internal hemorrhoids were found during retroflexion. The hemorrhoids       were Grade I (internal hemorrhoids that do not prolapse).      The exam was otherwise without abnormality on direct and retroflexion       views. The cecum was very shallow so difficult to get a clear  view       behind the valve but after torqueing down and attempting to pull back       the IC valve, no obvious lesions were noted. Impression:            - Internal hemorrhoids.                        - The examination was otherwise normal on direct and                         retroflexion views.                        - No specimens collected. Recommendation:        - Discharge patient to home.                        - Resume previous diet.                        - Continue present medications.                        - Repeat colonoscopy is not recommended due to current                         age (78 years or older) for screening purposes.                        - Return to referring physician as previously                         scheduled. Procedure Code(s):     --- Professional ---                        L2440, Colorectal cancer screening; colonoscopy on                         individual not meeting criteria for high risk Diagnosis Code(s):     --- Professional ---                        Z12.11, Encounter  for screening for malignant neoplasm                         of colon                        K64.0, First degree hemorrhoids CPT copyright 2019 American Medical Association. All rights reserved. The codes documented in this report are preliminary and upon coder review may  be revised to meet current compliance requirements. Andrey Farmer MD, MD 06/14/2021 1:46:52 PM Number of Addenda: 0 Note Initiated On: 06/14/2021 12:25 PM Scope Withdrawal Time: 0 hours 7 minutes 14 seconds  Total Procedure Duration: 0 hours 16 minutes 43 seconds  Estimated Blood Loss:  Estimated blood loss: none.      [facility]

## 2021-06-14 NOTE — Transfer of Care (Signed)
Immediate Anesthesia Transfer of Care Note  Patient: Robin Allen  Procedure(s) Performed: COLONOSCOPY WITH PROPOFOL  Patient Location: PACU  Anesthesia Type:General  Level of Consciousness: drowsy  Airway & Oxygen Therapy: Patient Spontanous Breathing  Post-op Assessment: Report given to RN and Post -op Vital signs reviewed and stable  Post vital signs: Reviewed and stable  Last Vitals:  Vitals Value Taken Time  BP 122/65 1345  Temp 36 1345  Pulse 74 1345  Resp 16 1345  SpO2 97 1345    Last Pain:  Vitals:   06/14/21 1249  TempSrc: Temporal  PainSc: 0-No pain         Complications: No notable events documented.

## 2021-06-14 NOTE — Interval H&P Note (Signed)
History and Physical Interval Note:  01/17/4816 5:63 PM  Robin Allen  has presented today for surgery, with the diagnosis of CCA SCREEN.  The various methods of treatment have been discussed with the patient and family. After consideration of risks, benefits and other options for treatment, the patient has consented to  Procedure(s): COLONOSCOPY WITH PROPOFOL (N/A) as a surgical intervention.  The patient's history has been reviewed, patient examined, no change in status, stable for surgery.  I have reviewed the patient's chart and labs.  Questions were answered to the patient's satisfaction.     Lesly Rubenstein  Ok to proceed with colonoscopy

## 2021-06-14 NOTE — H&P (Signed)
Outpatient short stay form Pre-procedure 06/14/2021  Robin Rubenstein, MD  Primary Physician: [name], MD  Reason for visit:  Screening  History of present illness:    76 y/o lady with a. Fib on coumadin with last dose 14 days ago, history of cholecystectomy, and chronic pain here for screening colonoscopy. Last colonoscopy in 2012 and was normal.    Current Facility-Administered Medications:    0.9 %  sodium chloride infusion, , Intravenous, Continuous, Caylan Chenard, Hilton Cork, MD, Last Rate: 20 mL/hr at 06/14/21 1311, 1,000 mL at 06/14/21 1311  Medications Prior to Admission  Medication Sig Dispense Refill Last Dose   allopurinol (ZYLOPRIM) 100 MG tablet TAKE 1 TABLET BY MOUTH ONCE DAILY   06/13/2021   dicyclomine (BENTYL) 20 MG tablet    Past Month   FLUZONE HIGH-DOSE 0.5 ML SUSY    Past Month   Garlic 315 MG TABS Take by mouth.   Past Month   metoprolol tartrate (LOPRESSOR) 50 MG tablet Take 1.5 tablets (75 mg total) by mouth 2 (two) times daily. 270 tablet 3 06/14/2021   oxyCODONE (OXY IR/ROXICODONE) 5 MG immediate release tablet Take 1-2 tablets (5-10 mg total) by mouth every 4 (four) hours as needed for moderate pain or severe pain. 30 tablet 0 Past Month   oxyCODONE-acetaminophen (PERCOCET) 5-325 MG tablet Take 1 tablet by mouth every 6 (six) hours as needed for severe pain. 10 tablet 0 Past Month   aspirin EC 81 MG tablet Take 81 mg by mouth daily. (Patient not taking: Reported on 06/14/2021)   Not Taking   ondansetron (ZOFRAN) 4 MG tablet Take 1 tablet (4 mg total) by mouth every 8 (eight) hours as needed for nausea or vomiting. (Patient not taking: Reported on 06/14/2021) 21 tablet 0 Completed Course   warfarin (COUMADIN) 5 MG tablet Take 5 mg by mouth daily.   05/31/2021     Allergies  Allergen Reactions   Augmentin [Amoxicillin-Pot Clavulanate]     nausea     Past Medical History:  Diagnosis Date   Anemia    Atrial fibrillation ([place])    Chronic kidney disease     Coronary artery disease    Depression    Gout    Hyperlipidemia    Hypertension    Hyperuricemia    Hypothyroidism     Review of systems:  Otherwise negative.    Physical Exam  Gen: Alert, oriented. Appears stated age.  HEENT:PERRLA. Lungs: No respiratory distress CV: RRR Abd: soft, benign, no masses Ext: No edema    Planned procedures: Proceed with EGD/colonoscopy. The patient understands the nature of the planned procedure, indications, risks, alternatives and potential complications including but not limited to bleeding, infection, perforation, damage to internal organs and possible oversedation/side effects from anesthesia. The patient agrees and gives consent to proceed.  Please refer to procedure notes for findings, recommendations and patient disposition/instructions.     Robin Rubenstein, MD [facility]

## 2021-06-15 ENCOUNTER — Encounter: Payer: Self-pay | Admitting: Gastroenterology

## 2021-06-17 DIAGNOSIS — M8580 Other specified disorders of bone density and structure, unspecified site: Secondary | ICD-10-CM | POA: Diagnosis not present

## 2021-06-17 DIAGNOSIS — N1831 Chronic kidney disease, stage 3a: Secondary | ICD-10-CM | POA: Diagnosis not present

## 2021-06-17 DIAGNOSIS — I4891 Unspecified atrial fibrillation: Secondary | ICD-10-CM | POA: Diagnosis not present

## 2021-06-17 DIAGNOSIS — E7800 Pure hypercholesterolemia, unspecified: Secondary | ICD-10-CM | POA: Diagnosis not present

## 2021-06-17 DIAGNOSIS — Z87891 Personal history of nicotine dependence: Secondary | ICD-10-CM | POA: Diagnosis not present

## 2021-06-17 DIAGNOSIS — F419 Anxiety disorder, unspecified: Secondary | ICD-10-CM | POA: Diagnosis not present

## 2021-06-17 DIAGNOSIS — M109 Gout, unspecified: Secondary | ICD-10-CM | POA: Diagnosis not present

## 2021-06-17 DIAGNOSIS — I129 Hypertensive chronic kidney disease with stage 1 through stage 4 chronic kidney disease, or unspecified chronic kidney disease: Secondary | ICD-10-CM | POA: Diagnosis not present

## 2021-06-17 DIAGNOSIS — E669 Obesity, unspecified: Secondary | ICD-10-CM | POA: Diagnosis not present

## 2021-06-25 DIAGNOSIS — I480 Paroxysmal atrial fibrillation: Secondary | ICD-10-CM | POA: Diagnosis not present

## 2021-07-04 DIAGNOSIS — I480 Paroxysmal atrial fibrillation: Secondary | ICD-10-CM | POA: Diagnosis not present

## 2021-07-18 DIAGNOSIS — Z7901 Long term (current) use of anticoagulants: Secondary | ICD-10-CM | POA: Diagnosis not present

## 2021-07-18 DIAGNOSIS — Z5181 Encounter for therapeutic drug level monitoring: Secondary | ICD-10-CM | POA: Diagnosis not present

## 2021-08-06 DIAGNOSIS — I480 Paroxysmal atrial fibrillation: Secondary | ICD-10-CM | POA: Diagnosis not present

## 2021-08-27 ENCOUNTER — Ambulatory Visit: Payer: Medicare HMO | Admitting: Medical

## 2021-08-27 ENCOUNTER — Encounter: Payer: Self-pay | Admitting: Medical

## 2021-08-27 ENCOUNTER — Ambulatory Visit: Payer: Medicare HMO | Admitting: Cardiovascular Disease

## 2021-08-27 VITALS — BP 140/88 | HR 72 | Ht 65.0 in | Wt 189.2 lb

## 2021-08-27 DIAGNOSIS — I4819 Other persistent atrial fibrillation: Secondary | ICD-10-CM

## 2021-08-27 DIAGNOSIS — I10 Essential (primary) hypertension: Secondary | ICD-10-CM

## 2021-08-27 DIAGNOSIS — E782 Mixed hyperlipidemia: Secondary | ICD-10-CM | POA: Diagnosis not present

## 2021-08-27 DIAGNOSIS — R55 Syncope and collapse: Secondary | ICD-10-CM

## 2021-08-27 DIAGNOSIS — I480 Paroxysmal atrial fibrillation: Secondary | ICD-10-CM | POA: Diagnosis not present

## 2021-08-27 NOTE — Progress Notes (Signed)
Cardiology Office Note:    Date:  02/09/5168   ID:  Robin Allen, DOB 1945-09-26, MRN [id number]  PCP:  [name], MD  [facility] Cardiologist:  Ida Rogue, MD  [facility] Electrophysiologist:  None   Referring MD: Madelyn Brunner, MD   Chief Complaint: 3 month follow-up  History of Present Illness:    Robin Allen is a 76 y.o. female with a hx of permanent Afib, prior tobacco use, HLD who is being seen for 3 month follow-up.   Echo June 2022 showed normal LVEF, moderate TR.  ER visit for syncope in 05/15/2021 she woke up to get water and woke up on the floor, she hit her head and elbow. EKG showed rate controlled Afib.   Last seen 05/22/21 and felt syncope was orthostatic. Triamterene-HCTZ was held and metoprolol was increased for afib rates. No further work-up was performed.   Today, the patient reports she is overall doing well. She denies any syncope. She has occasional orthostatic symptoms, but is very careful changing positions. No chest pain or shortness of breath. Occasional LLE. No orthopnea or pnd. It appears she also follows with [facility] cardiology.   Past Medical History:  Diagnosis Date   Anemia    Atrial fibrillation ([place])    Chronic kidney disease    Coronary artery disease    Depression    Gout    Hyperlipidemia    Hypertension    Hyperuricemia    Hypothyroidism     Past Surgical History:  Procedure Laterality Date   ABDOMINAL HYSTERECTOMY  1995   Total, lower midline incision   BREAST BIOPSY Right 2016   neg   CHOLECYSTECTOMY N/A 12/16/2015   Procedure: LAPAROSCOPIC CHOLECYSTECTOMY  with cholangiogram;  Surgeon: Olean Ree, MD;  Location: [facility] ORS;  Service: General;  Laterality: N/A;   COLONOSCOPY WITH PROPOFOL N/A 06/14/2021   Procedure: COLONOSCOPY WITH PROPOFOL;  Surgeon: Lesly Rubenstein, MD;  Location: [facility] ENDOSCOPY;  Service: Endoscopy;  Laterality: N/A;   TONSILLECTOMY      Current Medications: Current Meds   Medication Sig   allopurinol (ZYLOPRIM) 100 MG tablet TAKE 1 TABLET BY MOUTH ONCE DAILY   aspirin EC 81 MG tablet Take 81 mg by mouth daily.   dicyclomine (BENTYL) 20 MG tablet    Garlic 759 MG TABS Take by mouth.   metoprolol tartrate (LOPRESSOR) 50 MG tablet Take 1.5 tablets (75 mg total) by mouth 2 (two) times daily.   oxyCODONE (OXY IR/ROXICODONE) 5 MG immediate release tablet Take 1-2 tablets (5-10 mg total) by mouth every 4 (four) hours as needed for moderate pain or severe pain.   oxyCODONE-acetaminophen (PERCOCET) 5-325 MG tablet Take 1 tablet by mouth every 6 (six) hours as needed for severe pain.   rosuvastatin (CRESTOR) 5 MG tablet Take 5 mg by mouth daily.   sertraline (ZOLOFT) 50 MG tablet Take 50 mg by mouth daily.   warfarin (COUMADIN) 5 MG tablet Take 5 mg by mouth daily.     Allergies:   Augmentin [amoxicillin-pot clavulanate]   Social History   Socioeconomic History   Marital status: Widowed    Spouse name: Not on file   Number of children: Not on file   Years of education: Not on file   Highest education level: Not on file  Occupational History   Not on file  Tobacco Use   Smoking status: Former    Packs/day: 0.10    Years: 30.00    Total  pack years: 3.00    Types: Cigarettes   Smokeless tobacco: Never  Vaping Use   Vaping Use: Never used  Substance and Sexual Activity   Alcohol use: No   Drug use: No   Sexual activity: Not on file  Other Topics Concern   Not on file  Social History Narrative   Not on file   Social Determinants of Health   Financial Resource Strain: Not on file  Food Insecurity: Not on file  Transportation Needs: Not on file  Physical Activity: Not on file  Stress: Not on file  Social Connections: Not on file     Family History: The patient's family history includes Basal cell carcinoma in her mother; Hypertension in her father and mother; Melanoma in her father.  ROS:   Please see the history of present illness.     All  other systems reviewed and are negative.  EKGs/Labs/Other Studies Reviewed:    The following studies were reviewed today:  Echo 06/2020 INTERPRETATION  NORMAL LEFT VENTRICULAR SYSTOLIC FUNCTION   WITH MILD LVH  NORMAL RIGHT VENTRICULAR SYSTOLIC FUNCTION  MODERATE VALVULAR REGURGITATION (See above)  NO VALVULAR STENOSIS  ESTIMATED LVEF >55%  MODERATE TRICUSPID REGURGITATION  MILD MITRAL REGURGITATION  MODERATE BI-ATRIAL ENLARGEMENT  IRREGULAR HEART RHYTHM THROUGHOUT EXAM  Tricuspid: MODERATE TR   EKG:  EKG is ordered today.  The ekg ordered today demonstrates Afib 72bpm, RBBB, tWI inferolateral leads, no change  Recent Labs: 05/23/2021: BUN 25; Creatinine, Ser 1.05; Hemoglobin 13.8; Platelets 205; Potassium 2.9; Sodium 133  Recent Lipid Panel No results found for: "CHOL", "TRIG", "HDL", "CHOLHDL", "VLDL", "LDLCALC", "LDLDIRECT"   Physical Exam:    VS:  BP (!) 140/88 (BP Location: Left Arm, Patient Position: Sitting, Cuff Size: Normal)   Pulse 72   Ht '5\' 5"'$  (1.651 m)   Wt 189 lb 3.2 oz (85.8 kg)   BMI 31.48 kg/m     Wt Readings from Last 3 Encounters:  08/27/21 189 lb 3.2 oz (85.8 kg)  06/14/21 190 lb (86.2 kg)  05/23/21 186 lb 15.2 oz (84.8 kg)     GEN:  Well nourished, well developed in no acute distress HEENT: Normal NECK: No JVD; No carotid bruits LYMPHATICS: No lymphadenopathy CARDIAC: RRR, no murmurs, rubs, gallops RESPIRATORY:  Clear to auscultation without rales, wheezing or rhonchi  ABDOMEN: Soft, non-tender, non-distended MUSCULOSKELETAL:  No edema; No deformity  SKIN: Warm and dry NEUROLOGIC:  Alert and oriented x 3 PSYCHIATRIC:  Normal affect   ASSESSMENT:    1. Syncope and collapse   2. Persistent atrial fibrillation ([place])   3. Hyperlipidemia, mixed   4. Essential hypertension    PLAN:    In order of problems listed above:  Syncope Patient denies further syncope. She has occasional orthostatic symptoms and is very careful changing positions.  Echo in 2022 showed normal LVEF. She stays well hydrated. No further work-up indicated at this time.   Permanent Afib EKG shows Afib with well-controlled rates. Continue Lopressor '75mg'$ BID. Continue warfarin for stroke ppx. Most recent INR 1.4, goal 2-3. This is followed by [facility]. She can continue to follow with only [facility].   HLD LDL 69. Continue Crestor '5mg'$  daily.   HTN BP is mildly elevated. I will not make any changes given intermittent orthostasis. Continue Lopressor '75mg'$ BID.   Disposition: Follow up in 6 month(s) with MD/APP    Signed, Bernarr Longsworth Ninfa Meeker, PA-C  08/27/2021 11:29 AM    [facility]

## 2021-08-27 NOTE — Patient Instructions (Signed)
Medication Instructions:   Your physician recommends that you continue on your current medications as directed. Please refer to the Current Medication list given to you today.  *If you need a refill on your cardiac medications before your next appointment, please call your pharmacy*   Lab Work:  None ordered  Testing/Procedures:  None ordered   Follow-Up: At [facility], you and your health needs are our priority.  As part of our continuing mission to provide you with exceptional heart care, we have created designated Provider Care Teams.  These Care Teams include your primary Cardiologist (physician) and Advanced Practice Providers (APPs -  Physician Assistants and Nurse Practitioners) who all work together to provide you with the care you need, when you need it.  We recommend signing up for the patient portal called "MyChart".  Sign up information is provided on this After Visit Summary.  MyChart is used to connect with patients for Virtual Visits (Telemedicine).  Patients are able to view lab/test results, encounter notes, upcoming appointments, etc.  Non-urgent messages can be sent to your provider as well.   To learn more about what you can do with MyChart, go to [email or web address].    Your next appointment:   6 month(s)  The format for your next appointment:   In Person  Provider:   You may see Dr. Ida Rogue or one of the following Advanced Practice Providers on your designated Care Team:   Murray Hodgkins, NP Christell Faith, PA-C Cadence Kathlen Mody, [place]   Important Information About Sugar

## 2021-08-28 ENCOUNTER — Ambulatory Visit: Payer: Medicare HMO | Admitting: Dermatology

## 2021-08-28 DIAGNOSIS — D1801 Hemangioma of skin and subcutaneous tissue: Secondary | ICD-10-CM | POA: Diagnosis not present

## 2021-08-28 DIAGNOSIS — L570 Actinic keratosis: Secondary | ICD-10-CM | POA: Diagnosis not present

## 2021-08-28 DIAGNOSIS — L820 Inflamed seborrheic keratosis: Secondary | ICD-10-CM

## 2021-08-28 DIAGNOSIS — L821 Other seborrheic keratosis: Secondary | ICD-10-CM | POA: Diagnosis not present

## 2021-08-28 DIAGNOSIS — L578 Other skin changes due to chronic exposure to nonionizing radiation: Secondary | ICD-10-CM

## 2021-08-28 DIAGNOSIS — D485 Neoplasm of uncertain behavior of skin: Secondary | ICD-10-CM | POA: Diagnosis not present

## 2021-08-28 DIAGNOSIS — D492 Neoplasm of unspecified behavior of bone, soft tissue, and skin: Secondary | ICD-10-CM

## 2021-08-28 NOTE — Patient Instructions (Addendum)
Wound Care Instructions  Cleanse wound gently with soap and water once a day then pat dry with clean gauze. Apply a thin coat of Petrolatum (petroleum jelly, "Vaseline") over the wound (unless you have an allergy to this). We recommend that you use a new, sterile tube of Vaseline. Do not pick or remove scabs. Do not remove the yellow or white "healing tissue" from the base of the wound.  Cover the wound with fresh, clean, nonstick gauze and secure with paper tape. You may use Band-Aids in place of gauze and tape if the wound is small enough, but would recommend trimming much of the tape off as there is often too much. Sometimes Band-Aids can irritate the skin.  You should call the office for your biopsy report after 1 week if you have not already been contacted.  If you experience any problems, such as abnormal amounts of bleeding, swelling, significant bruising, significant pain, or evidence of infection, please call the office immediately.  FOR ADULT SURGERY PATIENTS: If you need something for pain relief you may take 1 extra strength Tylenol (acetaminophen) AND 2 Ibuprofen (200mg each) together every 4 hours as needed for pain. (do not take these if you are allergic to them or if you have a reason you should not take them.) Typically, you may only need pain medication for 1 to 3 days.     Cryotherapy Aftercare  Wash gently with soap and water everyday.   Apply Vaseline and Band-Aid daily until healed.    Due to recent changes in healthcare laws, you may see results of your pathology and/or laboratory studies on MyChart before the doctors have had a chance to review them. We understand that in some cases there may be results that are confusing or concerning to you. Please understand that not all results are received at the same time and often the doctors may need to interpret multiple results in order to provide you with the best plan of care or course of treatment. Therefore, we ask that you  please give us 2 business days to thoroughly review all your results before contacting the office for clarification. Should we see a critical lab result, you will be contacted sooner.   If You Need Anything After Your Visit  If you have any questions or concerns for your doctor, please call our main line at [phone] and press option 4 to reach your doctor's medical assistant. If no one answers, please leave a voicemail as directed and we will return your call as soon as possible. Messages left after 4 pm will be answered the following business day.   You may also send us a message via MyChart. We typically respond to MyChart messages within 1-2 business days.  For prescription refills, please ask your pharmacy to contact our office. Our fax number is [phone].  If you have an urgent issue when the clinic is closed that cannot wait until the next business day, you can page your doctor at the number below.    Please note that while we do our best to be available for urgent issues outside of office hours, we are not available 24/7.   If you have an urgent issue and are unable to reach us, you may choose to seek medical care at your doctor's office, retail clinic, urgent care center, or emergency room.  If you have a medical emergency, please immediately call 911 or go to the emergency department.  Pager Numbers  - Dr. Kowalski: [phone]  -   Dr. Moye: [phone]  - Dr. Stewart: [phone]  In the event of inclement weather, please call our main line at [phone] for an update on the status of any delays or closures.  Dermatology Medication Tips: Please keep the boxes that topical medications come in in order to help keep track of the instructions about where and how to use these. Pharmacies typically print the medication instructions only on the boxes and not directly on the medication tubes.   If your medication is too expensive, please contact our office at  [phone] option 4 or send us a message through MyChart.   We are unable to tell what your co-pay for medications will be in advance as this is different depending on your insurance coverage. However, we may be able to find a substitute medication at lower cost or fill out paperwork to get insurance to cover a needed medication.   If a prior authorization is required to get your medication covered by your insurance company, please allow us 1-2 business days to complete this process.  Drug prices often vary depending on where the prescription is filled and some pharmacies may offer cheaper prices.  The website www.goodrx.com contains coupons for medications through different pharmacies. The prices here do not account for what the cost may be with help from insurance (it may be cheaper with your insurance), but the website can give you the price if you did not use any insurance.  - You can print the associated coupon and take it with your prescription to the pharmacy.  - You may also stop by our office during regular business hours and pick up a GoodRx coupon card.  - If you need your prescription sent electronically to a different pharmacy, notify our office through [facility] MyChart or by phone at [phone] option 4.     Si Usted Necesita Algo Despus de Su Visita  Tambin puede enviarnos un mensaje a travs de MyChart. Por lo general respondemos a los mensajes de MyChart en el transcurso de 1 a 2 das hbiles.  Para renovar recetas, por favor pida a su farmacia que se ponga en contacto con nuestra oficina. Nuestro nmero de fax es el [phone].  Si tiene un asunto urgente cuando la clnica est cerrada y que no puede esperar hasta el siguiente da hbil, puede llamar/localizar a su doctor(a) al nmero que aparece a continuacin.   Por favor, tenga en cuenta que aunque hacemos todo lo posible para estar disponibles para asuntos urgentes fuera del horario de oficina, no estamos  disponibles las 24 horas del da, los 7 das de la semana.   Si tiene un problema urgente y no puede comunicarse con nosotros, puede optar por buscar atencin mdica  en el consultorio de su doctor(a), en una clnica privada, en un centro de atencin urgente o en una sala de emergencias.  Si tiene una emergencia mdica, por favor llame inmediatamente al 911 o vaya a la sala de emergencias.  Nmeros de bper  - Dr. Kowalski: [phone]  - Dra. Moye: [phone]  - Dra. Stewart: [phone]  En caso de inclemencias del tiempo, por favor llame a nuestra lnea principal al [phone] para una actualizacin sobre el estado de cualquier retraso o cierre.  Consejos para la medicacin en dermatologa: Por favor, guarde las cajas en las que vienen los medicamentos de uso tpico para ayudarle a seguir las instrucciones sobre dnde y cmo usarlos. Las farmacias generalmente imprimen las instrucciones del medicamento slo en las cajas y   no directamente en los tubos del medicamento.   Si su medicamento es muy caro, por favor, pngase en contacto con nuestra oficina llamando al [phone] y presione la opcin 4 o envenos un mensaje a travs de MyChart.   No podemos decirle cul ser su copago por los medicamentos por adelantado ya que esto es diferente dependiendo de la cobertura de su seguro. Sin embargo, es posible que podamos encontrar un medicamento sustituto a menor costo o llenar un formulario para que el seguro cubra el medicamento que se considera necesario.   Si se requiere una autorizacin previa para que su compaa de seguros cubra su medicamento, por favor permtanos de 1 a 2 das hbiles para completar este proceso.  Los precios de los medicamentos varan con frecuencia dependiendo del lugar de dnde se surte la receta y alguna farmacias pueden ofrecer precios ms baratos.  El sitio web www.goodrx.com tiene cupones para medicamentos de diferentes farmacias. Los precios aqu no  tienen en cuenta lo que podra costar con la ayuda del seguro (puede ser ms barato con su seguro), pero el sitio web puede darle el precio si no utiliz ningn seguro.  - Puede imprimir el cupn correspondiente y llevarlo con su receta a la farmacia.  - Tambin puede pasar por nuestra oficina durante el horario de atencin regular y recoger una tarjeta de cupones de GoodRx.  - Si necesita que su receta se enve electrnicamente a una farmacia diferente, informe a nuestra oficina a travs de MyChart de [facility] o por telfono llamando al [phone] y presione la opcin 4.  

## 2021-08-28 NOTE — Progress Notes (Signed)
New Patient Visit  Subjective  Robin Allen is a 76 y.o. female who presents for the following: Skin Problem (Check a spot on the left breast that will sometimes bleed). The patient has spots, moles and lesions to be evaluated, some may be new or changing.   The following portions of the chart were reviewed this encounter and updated as appropriate:   Tobacco  Allergies  Meds  Problems  Med Hx  Surg Hx  Fam Hx     Review of Systems:  No other skin or systemic complaints except as noted in HPI or Assessment and Plan.  Objective  Well appearing patient in no apparent distress; mood and affect are within normal limits.  A focused examination was performed including left breast,face . Relevant physical exam findings are noted in the Assessment and Plan.  nose x 3, left temple x 1  (4) (4) Erythematous thin papules/macules with gritty scale.   left lateral breast 1.2 cm bleeding lesion/purple papule          right cheek x 2, right forehead x 1  (3) (3) Stuck-on, waxy, tan-brown papule --Discussed benign etiology and prognosis.    Assessment & Plan  AK (actinic keratosis) (4) nose x 3, left temple x 1  (4)  Actinic keratoses are precancerous spots that appear secondary to cumulative UV radiation exposure/sun exposure over time. They are chronic with expected duration over 1 year. A portion of actinic keratoses will progress to squamous cell carcinoma of the skin. It is not possible to reliably predict which spots will progress to skin cancer and so treatment is recommended to prevent development of skin cancer.  Recommend daily broad spectrum sunscreen SPF 30+ to sun-exposed areas, reapply every 2 hours as needed.  Recommend staying in the shade or wearing long sleeves, sun glasses (UVA+UVB protection) and wide brim hats (4-inch brim around the entire circumference of the hat). Call for new or changing lesions.   Destruction of lesion - nose x 3, left temple x 1   (4) Complexity: simple   Destruction method: cryotherapy   Informed consent: discussed and consent obtained   Timeout:  patient name, date of birth, surgical site, and procedure verified Lesion destroyed using liquid nitrogen: Yes   Region frozen until ice ball extended beyond lesion: Yes   Outcome: patient tolerated procedure well with no complications   Post-procedure details: wound care instructions given    Neoplasm of skin left lateral breast  Epidermal / dermal shaving  Lesion diameter (cm):  1.2 Informed consent: discussed and consent obtained   Timeout: patient name, date of birth, surgical site, and procedure verified   Anesthesia: the lesion was anesthetized in a standard fashion   Anesthetic:  1% lidocaine w/ epinephrine 1-100,000 local infiltration Instrument used: flexible razor blade   Hemostasis achieved with: aluminum chloride   Outcome: patient tolerated procedure well   Post-procedure details: wound care instructions given   Additional details:  Mupirocin and a bandage applied  Specimen 1 - Surgical pathology Differential Diagnosis:  Hemangioma R/O Dysplastic nevus   Check Margins: No  Inflamed seborrheic keratosis (3) right cheek x 2, right forehead x 1  (3)  Symptomatic, irritating, patient would like treated.   Destruction of lesion - right cheek x 2, right forehead x 1  (3) Complexity: simple   Destruction method: cryotherapy   Informed consent: discussed and consent obtained   Timeout:  patient name, date of birth, surgical site, and procedure verified Lesion destroyed  using liquid nitrogen: Yes   Region frozen until ice ball extended beyond lesion: Yes   Outcome: patient tolerated procedure well with no complications   Post-procedure details: wound care instructions given    Actinic Damage - chronic, secondary to cumulative UV radiation exposure/sun exposure over time - diffuse scaly erythematous macules with underlying dyspigmentation -  Recommend daily broad spectrum sunscreen SPF 30+ to sun-exposed areas, reapply every 2 hours as needed.  - Recommend staying in the shade or wearing long sleeves, sun glasses (UVA+UVB protection) and wide brim hats (4-inch brim around the entire circumference of the hat). - Call for new or changing lesions.  Seborrheic Keratoses - Stuck-on, waxy, tan-brown papules and/or plaques  - Benign-appearing - Discussed benign etiology and prognosis. - Observe - Call for any changes  Return in about 5 months (around 01/28/2022) for Aks, ISK .  I, Marye Round, CMA, am acting as scribe for Sarina Ser, MD .  Documentation: I have reviewed the above documentation for accuracy and completeness, and I agree with the above.  Sarina Ser, MD

## 2021-09-02 ENCOUNTER — Telehealth: Payer: Self-pay

## 2021-09-02 NOTE — Telephone Encounter (Signed)
-----   Message from Ralene Bathe, MD sent at 09/02/2021 11:00 AM EDT ----- Diagnosis Skin , left lateral breast HEMANGIOMA, BASE INVOLVED  Benign hemangioma =collection of blood vessels As suspected No further treatment needed

## 2021-09-02 NOTE — Telephone Encounter (Signed)
Patient informed of pathology results 

## 2021-09-03 ENCOUNTER — Encounter: Payer: Self-pay | Admitting: Dermatology

## 2021-09-10 DIAGNOSIS — I480 Paroxysmal atrial fibrillation: Secondary | ICD-10-CM | POA: Diagnosis not present

## 2021-09-23 DIAGNOSIS — M10071 Idiopathic gout, right ankle and foot: Secondary | ICD-10-CM | POA: Diagnosis not present

## 2021-10-02 DIAGNOSIS — Z5181 Encounter for therapeutic drug level monitoring: Secondary | ICD-10-CM | POA: Diagnosis not present

## 2021-10-02 DIAGNOSIS — Z7901 Long term (current) use of anticoagulants: Secondary | ICD-10-CM | POA: Diagnosis not present

## 2021-10-31 DIAGNOSIS — Z5181 Encounter for therapeutic drug level monitoring: Secondary | ICD-10-CM | POA: Diagnosis not present

## 2021-10-31 DIAGNOSIS — Z7901 Long term (current) use of anticoagulants: Secondary | ICD-10-CM | POA: Diagnosis not present

## 2021-12-02 DIAGNOSIS — I480 Paroxysmal atrial fibrillation: Secondary | ICD-10-CM | POA: Diagnosis not present

## 2021-12-23 DIAGNOSIS — I4891 Unspecified atrial fibrillation: Secondary | ICD-10-CM | POA: Diagnosis not present

## 2021-12-23 DIAGNOSIS — I129 Hypertensive chronic kidney disease with stage 1 through stage 4 chronic kidney disease, or unspecified chronic kidney disease: Secondary | ICD-10-CM | POA: Diagnosis not present

## 2021-12-23 DIAGNOSIS — M109 Gout, unspecified: Secondary | ICD-10-CM | POA: Diagnosis not present

## 2021-12-23 DIAGNOSIS — E7800 Pure hypercholesterolemia, unspecified: Secondary | ICD-10-CM | POA: Diagnosis not present

## 2021-12-23 DIAGNOSIS — F419 Anxiety disorder, unspecified: Secondary | ICD-10-CM | POA: Diagnosis not present

## 2021-12-23 DIAGNOSIS — Z23 Encounter for immunization: Secondary | ICD-10-CM | POA: Diagnosis not present

## 2021-12-23 DIAGNOSIS — N1831 Chronic kidney disease, stage 3a: Secondary | ICD-10-CM | POA: Diagnosis not present

## 2021-12-23 DIAGNOSIS — I10 Essential (primary) hypertension: Secondary | ICD-10-CM | POA: Diagnosis not present

## 2021-12-23 DIAGNOSIS — R7309 Other abnormal glucose: Secondary | ICD-10-CM | POA: Diagnosis not present

## 2021-12-23 DIAGNOSIS — Z0000 Encounter for general adult medical examination without abnormal findings: Secondary | ICD-10-CM | POA: Diagnosis not present

## 2021-12-23 DIAGNOSIS — Z1331 Encounter for screening for depression: Secondary | ICD-10-CM | POA: Diagnosis not present

## 2022-01-30 ENCOUNTER — Ambulatory Visit: Payer: Medicare HMO | Admitting: Dermatology

## 2022-02-05 ENCOUNTER — Ambulatory Visit: Payer: Medicare HMO | Admitting: Dermatology

## 2022-02-05 VITALS — BP 154/95

## 2022-02-05 DIAGNOSIS — L578 Other skin changes due to chronic exposure to nonionizing radiation: Secondary | ICD-10-CM | POA: Diagnosis not present

## 2022-02-05 DIAGNOSIS — L570 Actinic keratosis: Secondary | ICD-10-CM

## 2022-02-05 DIAGNOSIS — L814 Other melanin hyperpigmentation: Secondary | ICD-10-CM

## 2022-02-05 DIAGNOSIS — D229 Melanocytic nevi, unspecified: Secondary | ICD-10-CM

## 2022-02-05 DIAGNOSIS — L821 Other seborrheic keratosis: Secondary | ICD-10-CM

## 2022-02-05 DIAGNOSIS — L720 Epidermal cyst: Secondary | ICD-10-CM

## 2022-02-05 DIAGNOSIS — Z7189 Other specified counseling: Secondary | ICD-10-CM | POA: Diagnosis not present

## 2022-02-05 DIAGNOSIS — L719 Rosacea, unspecified: Secondary | ICD-10-CM | POA: Diagnosis not present

## 2022-02-05 DIAGNOSIS — D1801 Hemangioma of skin and subcutaneous tissue: Secondary | ICD-10-CM

## 2022-02-05 DIAGNOSIS — L820 Inflamed seborrheic keratosis: Secondary | ICD-10-CM

## 2022-02-05 NOTE — Progress Notes (Signed)
Follow-Up Visit   Subjective  Robin Allen is a 77 y.o. female who presents for the following: Actinic Keratosis (5 month follow up - nose and left temple treated with LN2). The patient has spots, moles and lesions to be evaluated, some may be new or changing and the patient has concerns that these could be cancer.  The following portions of the chart were reviewed this encounter and updated as appropriate:   Tobacco  Allergies  Meds  Problems  Med Hx  Surg Hx  Fam Hx     Review of Systems:  No other skin or systemic complaints except as noted in HPI or Assessment and Plan.  Objective  Well appearing patient in no apparent distress; mood and affect are within normal limits.  A focused examination was performed including face. Relevant physical exam findings are noted in the Assessment and Plan.  Left temple x 1, left nasal tip x 6, right cheek x 1, left earlobe x 1, left antitragus x 1 (10) Erythematous thin papules/macules with gritty scale.        Face Pink papule  Right Oral Commissure Subcutaneous nodule.   Head - Anterior (Face) Smooth white papule(s).   Left Preauricular Area (2) Erythematous stuck-on, waxy papule or plaque   Assessment & Plan   Actinic Damage - chronic, secondary to cumulative UV radiation exposure/sun exposure over time - diffuse scaly erythematous macules with underlying dyspigmentation - Recommend daily broad spectrum sunscreen SPF 30+ to sun-exposed areas, reapply every 2 hours as needed.  - Recommend staying in the shade or wearing long sleeves, sun glasses (UVA+UVB protection) and wide brim hats (4-inch brim around the entire circumference of the hat). - Call for new or changing lesions.  Hemangiomas - Red papules - Discussed benign nature - Observe - Call for any changes  Seborrheic Keratoses - Stuck-on, waxy, tan-brown papules and/or plaques  - Benign-appearing - Discussed benign etiology and prognosis. -  Observe - Call for any changes  AK (actinic keratosis) (10) Left temple x 1, left nasal tip x 6, right cheek x 1, left earlobe x 1, left antitragus x 1 May plan biopsy to left antitragus if persistent  Destruction of lesion - Left temple x 1, left nasal tip x 6, right cheek x 1, left earlobe x 1, left antitragus x 1 Complexity: simple   Destruction method: cryotherapy   Informed consent: discussed and consent obtained   Timeout:  patient name, date of birth, surgical site, and procedure verified Lesion destroyed using liquid nitrogen: Yes   Region frozen until ice ball extended beyond lesion: Yes   Outcome: patient tolerated procedure well with no complications   Post-procedure details: wound care instructions given    Rosacea Face Rosacea is a chronic progressive skin condition usually affecting the face of adults, causing redness and/or acne bumps. It is treatable but not curable. It sometimes affects the eyes (ocular rosacea) as well. It may respond to topical and/or systemic medication and can flare with stress, sun exposure, alcohol, exercise, topical steroids (including hydrocortisone/cortisone 10) and some foods.  Daily application of broad spectrum spf 30+ sunscreen to face is recommended to reduce flares.  Discussed treatment options - patient declines treatment today  Epidermal inclusion cyst Right Oral Commissure Benign-appearing.  Observation.  Call clinic for new or changing lesions.  Recommend daily use of broad spectrum spf 30+ sunscreen to sun-exposed areas.    Milia Head - Anterior (Face) Benign-appearing.  Observation.  Call clinic for new  or changing lesions.  Recommend daily use of broad spectrum spf 30+ sunscreen to sun-exposed areas.    Inflamed seborrheic keratosis (2) Left Preauricular Area Destruction of lesion - Left Preauricular Area Complexity: simple   Destruction method: cryotherapy   Informed consent: discussed and consent obtained   Timeout:   patient name, date of birth, surgical site, and procedure verified Lesion destroyed using liquid nitrogen: Yes   Region frozen until ice ball extended beyond lesion: Yes   Outcome: patient tolerated procedure well with no complications   Post-procedure details: wound care instructions given    Return in about 3 months (around 05/07/2022) for AK follow up.  I, Ashok Cordia, CMA, am acting as scribe for Sarina Ser, MD . Documentation: I have reviewed the above documentation for accuracy and completeness, and I agree with the above.  Sarina Ser, MD

## 2022-02-05 NOTE — Patient Instructions (Signed)
Cryotherapy Aftercare  Wash gently with soap and water everyday.   Apply Vaseline and Band-Aid daily until healed.     Due to recent changes in healthcare laws, you may see results of your pathology and/or laboratory studies on MyChart before the doctors have had a chance to review them. We understand that in some cases there may be results that are confusing or concerning to you. Please understand that not all results are received at the same time and often the doctors may need to interpret multiple results in order to provide you with the best plan of care or course of treatment. Therefore, we ask that you please give us 2 business days to thoroughly review all your results before contacting the office for clarification. Should we see a critical lab result, you will be contacted sooner.   If You Need Anything After Your Visit  If you have any questions or concerns for your doctor, please call our main line at [phone] and press option 4 to reach your doctor's medical assistant. If no one answers, please leave a voicemail as directed and we will return your call as soon as possible. Messages left after 4 pm will be answered the following business day.   You may also send us a message via MyChart. We typically respond to MyChart messages within 1-2 business days.  For prescription refills, please ask your pharmacy to contact our office. Our fax number is [phone].  If you have an urgent issue when the clinic is closed that cannot wait until the next business day, you can page your doctor at the number below.    Please note that while we do our best to be available for urgent issues outside of office hours, we are not available 24/7.   If you have an urgent issue and are unable to reach us, you may choose to seek medical care at your doctor's office, retail clinic, urgent care center, or emergency room.  If you have a medical emergency, please immediately call 911 or go to the  emergency department.  Pager Numbers  - Dr. Kowalski: [phone]  - Dr. Moye: [phone]  - Dr. Stewart: [phone]  In the event of inclement weather, please call our main line at [phone] for an update on the status of any delays or closures.  Dermatology Medication Tips: Please keep the boxes that topical medications come in in order to help keep track of the instructions about where and how to use these. Pharmacies typically print the medication instructions only on the boxes and not directly on the medication tubes.   If your medication is too expensive, please contact our office at [phone] option 4 or send us a message through MyChart.   We are unable to tell what your co-pay for medications will be in advance as this is different depending on your insurance coverage. However, we may be able to find a substitute medication at lower cost or fill out paperwork to get insurance to cover a needed medication.   If a prior authorization is required to get your medication covered by your insurance company, please allow us 1-2 business days to complete this process.  Drug prices often vary depending on where the prescription is filled and some pharmacies may offer cheaper prices.  The website www.goodrx.com contains coupons for medications through different pharmacies. The prices here do not account for what the cost may be with help from insurance (it may be cheaper with your insurance), but the website can   give you the price if you did not use any insurance.  - You can print the associated coupon and take it with your prescription to the pharmacy.  - You may also stop by our office during regular business hours and pick up a GoodRx coupon card.  - If you need your prescription sent electronically to a different pharmacy, notify our office through [facility] MyChart or by phone at [phone] option 4.     Si Usted Necesita Algo Despus de Su Visita  Tambin puede  enviarnos un mensaje a travs de MyChart. Por lo general respondemos a los mensajes de MyChart en el transcurso de 1 a 2 das hbiles.  Para renovar recetas, por favor pida a su farmacia que se ponga en contacto con nuestra oficina. Nuestro nmero de fax es el [phone].  Si tiene un asunto urgente cuando la clnica est cerrada y que no puede esperar hasta el siguiente da hbil, puede llamar/localizar a su doctor(a) al nmero que aparece a continuacin.   Por favor, tenga en cuenta que aunque hacemos todo lo posible para estar disponibles para asuntos urgentes fuera del horario de oficina, no estamos disponibles las 24 horas del da, los 7 das de la semana.   Si tiene un problema urgente y no puede comunicarse con nosotros, puede optar por buscar atencin mdica  en el consultorio de su doctor(a), en una clnica privada, en un centro de atencin urgente o en una sala de emergencias.  Si tiene una emergencia mdica, por favor llame inmediatamente al 911 o vaya a la sala de emergencias.  Nmeros de bper  - Dr. Kowalski: [phone]  - Dra. Moye: [phone]  - Dra. Stewart: [phone]  En caso de inclemencias del tiempo, por favor llame a nuestra lnea principal al [phone] para una actualizacin sobre el estado de cualquier retraso o cierre.  Consejos para la medicacin en dermatologa: Por favor, guarde las cajas en las que vienen los medicamentos de uso tpico para ayudarle a seguir las instrucciones sobre dnde y cmo usarlos. Las farmacias generalmente imprimen las instrucciones del medicamento slo en las cajas y no directamente en los tubos del medicamento.   Si su medicamento es muy caro, por favor, pngase en contacto con nuestra oficina llamando al [phone] y presione la opcin 4 o envenos un mensaje a travs de MyChart.   No podemos decirle cul ser su copago por los medicamentos por adelantado ya que esto es diferente dependiendo de la cobertura de su seguro.  Sin embargo, es posible que podamos encontrar un medicamento sustituto a menor costo o llenar un formulario para que el seguro cubra el medicamento que se considera necesario.   Si se requiere una autorizacin previa para que su compaa de seguros cubra su medicamento, por favor permtanos de 1 a 2 das hbiles para completar este proceso.  Los precios de los medicamentos varan con frecuencia dependiendo del lugar de dnde se surte la receta y alguna farmacias pueden ofrecer precios ms baratos.  El sitio web www.goodrx.com tiene cupones para medicamentos de diferentes farmacias. Los precios aqu no tienen en cuenta lo que podra costar con la ayuda del seguro (puede ser ms barato con su seguro), pero el sitio web puede darle el precio si no utiliz ningn seguro.  - Puede imprimir el cupn correspondiente y llevarlo con su receta a la farmacia.  - Tambin puede pasar por nuestra oficina durante el horario de atencin regular y recoger una tarjeta de cupones de GoodRx.  -   Si necesita que su receta se enve electrnicamente a una farmacia diferente, informe a nuestra oficina a travs de MyChart de [facility] o por telfono llamando al [phone] y presione la opcin 4.  

## 2022-02-13 ENCOUNTER — Encounter: Payer: Self-pay | Admitting: Dermatology

## 2022-02-25 ENCOUNTER — Telehealth: Payer: Self-pay | Admitting: *Deleted

## 2022-02-25 NOTE — Telephone Encounter (Signed)
Pt should contact [facility] cardiology for all future refills.   [place], Arnold Long, CMA Pt canceled appt with [facility] cardiology

## 2022-03-03 ENCOUNTER — Ambulatory Visit: Payer: Medicare HMO | Admitting: Cardiovascular Disease

## 2022-04-21 ENCOUNTER — Ambulatory Visit: Payer: Medicare HMO | Admitting: Dermatology

## 2022-04-21 VITALS — BP 129/78 | HR 85

## 2022-04-21 DIAGNOSIS — L821 Other seborrheic keratosis: Secondary | ICD-10-CM

## 2022-04-21 DIAGNOSIS — L570 Actinic keratosis: Secondary | ICD-10-CM

## 2022-04-21 DIAGNOSIS — L578 Other skin changes due to chronic exposure to nonionizing radiation: Secondary | ICD-10-CM | POA: Diagnosis not present

## 2022-04-21 DIAGNOSIS — L820 Inflamed seborrheic keratosis: Secondary | ICD-10-CM

## 2022-04-21 NOTE — Patient Instructions (Signed)
Cryotherapy Aftercare  Wash gently with soap and water everyday.   Apply Vaseline and Band-Aid daily until healed.     Due to recent changes in healthcare laws, you may see results of your pathology and/or laboratory studies on MyChart before the doctors have had a chance to review them. We understand that in some cases there may be results that are confusing or concerning to you. Please understand that not all results are received at the same time and often the doctors may need to interpret multiple results in order to provide you with the best plan of care or course of treatment. Therefore, we ask that you please give us 2 business days to thoroughly review all your results before contacting the office for clarification. Should we see a critical lab result, you will be contacted sooner.   If You Need Anything After Your Visit  If you have any questions or concerns for your doctor, please call our main line at [phone] and press option 4 to reach your doctor's medical assistant. If no one answers, please leave a voicemail as directed and we will return your call as soon as possible. Messages left after 4 pm will be answered the following business day.   You may also send us a message via MyChart. We typically respond to MyChart messages within 1-2 business days.  For prescription refills, please ask your pharmacy to contact our office. Our fax number is [phone].  If you have an urgent issue when the clinic is closed that cannot wait until the next business day, you can page your doctor at the number below.    Please note that while we do our best to be available for urgent issues outside of office hours, we are not available 24/7.   If you have an urgent issue and are unable to reach us, you may choose to seek medical care at your doctor's office, retail clinic, urgent care center, or emergency room.  If you have a medical emergency, please immediately call 911 or go to the  emergency department.  Pager Numbers  - Dr. Kowalski: [phone]  - Dr. Moye: [phone]  - Dr. Stewart: [phone]  In the event of inclement weather, please call our main line at [phone] for an update on the status of any delays or closures.  Dermatology Medication Tips: Please keep the boxes that topical medications come in in order to help keep track of the instructions about where and how to use these. Pharmacies typically print the medication instructions only on the boxes and not directly on the medication tubes.   If your medication is too expensive, please contact our office at [phone] option 4 or send us a message through MyChart.   We are unable to tell what your co-pay for medications will be in advance as this is different depending on your insurance coverage. However, we may be able to find a substitute medication at lower cost or fill out paperwork to get insurance to cover a needed medication.   If a prior authorization is required to get your medication covered by your insurance company, please allow us 1-2 business days to complete this process.  Drug prices often vary depending on where the prescription is filled and some pharmacies may offer cheaper prices.  The website www.goodrx.com contains coupons for medications through different pharmacies. The prices here do not account for what the cost may be with help from insurance (it may be cheaper with your insurance), but the website can   give you the price if you did not use any insurance.  - You can print the associated coupon and take it with your prescription to the pharmacy.  - You may also stop by our office during regular business hours and pick up a GoodRx coupon card.  - If you need your prescription sent electronically to a different pharmacy, notify our office through [facility] MyChart or by phone at [phone] option 4.     Si Usted Necesita Algo Despus de Su Visita  Tambin puede  enviarnos un mensaje a travs de MyChart. Por lo general respondemos a los mensajes de MyChart en el transcurso de 1 a 2 das hbiles.  Para renovar recetas, por favor pida a su farmacia que se ponga en contacto con nuestra oficina. Nuestro nmero de fax es el [phone].  Si tiene un asunto urgente cuando la clnica est cerrada y que no puede esperar hasta el siguiente da hbil, puede llamar/localizar a su doctor(a) al nmero que aparece a continuacin.   Por favor, tenga en cuenta que aunque hacemos todo lo posible para estar disponibles para asuntos urgentes fuera del horario de oficina, no estamos disponibles las 24 horas del da, los 7 das de la semana.   Si tiene un problema urgente y no puede comunicarse con nosotros, puede optar por buscar atencin mdica  en el consultorio de su doctor(a), en una clnica privada, en un centro de atencin urgente o en una sala de emergencias.  Si tiene una emergencia mdica, por favor llame inmediatamente al 911 o vaya a la sala de emergencias.  Nmeros de bper  - Dr. Kowalski: [phone]  - Dra. Moye: [phone]  - Dra. Stewart: [phone]  En caso de inclemencias del tiempo, por favor llame a nuestra lnea principal al [phone] para una actualizacin sobre el estado de cualquier retraso o cierre.  Consejos para la medicacin en dermatologa: Por favor, guarde las cajas en las que vienen los medicamentos de uso tpico para ayudarle a seguir las instrucciones sobre dnde y cmo usarlos. Las farmacias generalmente imprimen las instrucciones del medicamento slo en las cajas y no directamente en los tubos del medicamento.   Si su medicamento es muy caro, por favor, pngase en contacto con nuestra oficina llamando al [phone] y presione la opcin 4 o envenos un mensaje a travs de MyChart.   No podemos decirle cul ser su copago por los medicamentos por adelantado ya que esto es diferente dependiendo de la cobertura de su seguro.  Sin embargo, es posible que podamos encontrar un medicamento sustituto a menor costo o llenar un formulario para que el seguro cubra el medicamento que se considera necesario.   Si se requiere una autorizacin previa para que su compaa de seguros cubra su medicamento, por favor permtanos de 1 a 2 das hbiles para completar este proceso.  Los precios de los medicamentos varan con frecuencia dependiendo del lugar de dnde se surte la receta y alguna farmacias pueden ofrecer precios ms baratos.  El sitio web www.goodrx.com tiene cupones para medicamentos de diferentes farmacias. Los precios aqu no tienen en cuenta lo que podra costar con la ayuda del seguro (puede ser ms barato con su seguro), pero el sitio web puede darle el precio si no utiliz ningn seguro.  - Puede imprimir el cupn correspondiente y llevarlo con su receta a la farmacia.  - Tambin puede pasar por nuestra oficina durante el horario de atencin regular y recoger una tarjeta de cupones de GoodRx.  -   Si necesita que su receta se enve electrnicamente a una farmacia diferente, informe a nuestra oficina a travs de MyChart de [facility] o por telfono llamando al [phone] y presione la opcin 4.  

## 2022-04-21 NOTE — Progress Notes (Signed)
   Follow-Up Visit   Subjective  Robin Allen is a 77 y.o. female who presents for the following:  Skin check. The patient has spots, moles and lesions to be evaluated, some may be new or changing and the patient has concerns that these could be cancer.  The following portions of the chart were reviewed this encounter and updated as appropriate: medications, allergies, medical history  Review of Systems:  No other skin or systemic complaints except as noted in HPI or Assessment and Plan.  Objective  Well appearing patient in no apparent distress; mood and affect are within normal limits. A focused examination was performed of the following areas: face,scalp  Relevant exam findings are noted in the Assessment and Plan.   Assessment & Plan   INFLAMED SEBORRHEIC KERATOSIS Exam: Erythematous keratotic or waxy stuck-on papule or plaque.  Symptomatic, irritating, patient would like treated.  Benign-appearing.  Call clinic for new or changing lesions.   Prior to procedure, discussed risks of blister formation, small wound, skin dyspigmentation, or rare scar following treatment. Recommend Vaseline ointment to treated areas while healing.  Destruction Procedure Note Destruction method: cryotherapy   Informed consent: discussed and consent obtained   Lesion destroyed using liquid nitrogen: Yes   Outcome: patient tolerated procedure well with no complications   Post-procedure details: wound care instructions given   Locations: right cheek  # of Lesions Treated: 1   ACTINIC KERATOSIS Exam: Erythematous thin papules/macules with gritty scale  Actinic keratoses are precancerous spots that appear secondary to cumulative UV radiation exposure/sun exposure over time. They are chronic with expected duration over 1 year. A portion of actinic keratoses will progress to squamous cell carcinoma of the skin. It is not possible to reliably predict which spots will progress to skin cancer and so  treatment is recommended to prevent development of skin cancer.  Recommend daily broad spectrum sunscreen SPF 30+ to sun-exposed areas, reapply every 2 hours as needed.  Recommend staying in the shade or wearing long sleeves, sun glasses (UVA+UVB protection) and wide brim hats (4-inch brim around the entire circumference of the hat). Call for new or changing lesions.  Treatment Plan:  Prior to procedure, discussed risks of blister formation, small wound, skin dyspigmentation, or rare scar following cryotherapy. Recommend Vaseline ointment to treated areas while healing.  Destruction Procedure Note Destruction method: cryotherapy   Informed consent: discussed and consent obtained   Lesion destroyed using liquid nitrogen: Yes   Outcome: patient tolerated procedure well with no complications   Post-procedure details: wound care instructions given   Locations: face # of Lesions Treated: 11   SEBORRHEIC KERATOSIS - Stuck-on, waxy, tan-brown papules and/or plaques  - Benign-appearing - Discussed benign etiology and prognosis. - Observe - Call for any changes  ACTINIC DAMAGE - chronic, secondary to cumulative UV radiation exposure/sun exposure over time - diffuse scaly erythematous macules with underlying dyspigmentation - Recommend daily broad spectrum sunscreen SPF 30+ to sun-exposed areas, reapply every 2 hours as needed.  - Recommend staying in the shade or wearing long sleeves, sun glasses (UVA+UVB protection) and wide brim hats (4-inch brim around the entire circumference of the hat). - Call for new or changing lesions.  Return in about 6 months (around 10/21/2022) for Aks .  IAngelique Holm, CMA, am acting as scribe for Armida Sans, MD .   Documentation: I have reviewed the above documentation for accuracy and completeness, and I agree with the above.  Armida Sans, MD

## 2022-05-01 ENCOUNTER — Ambulatory Visit: Payer: Medicare HMO | Admitting: Dermatology

## 2022-05-05 ENCOUNTER — Encounter: Payer: Self-pay | Admitting: Dermatology

## 2022-10-11 IMAGING — CT CT MAXILLOFACIAL W/O CM
3 series · 16 of 47 positions shown, 19 images · non-contrast
Comparison: None Available.

CLINICAL DATA: Left-sided jaw pain



[Series 2: max soft · axial · 0.36mm/px · z∈[-146,-2]mm · 10 of 84 slices shown, 13 images]
[im 6/84  brain]
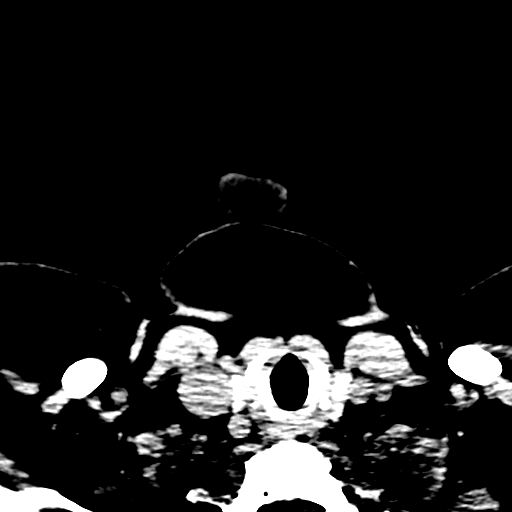
[im 6/84  bone]
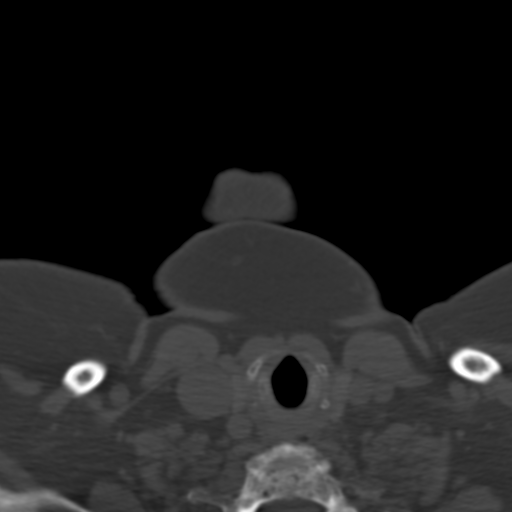
[im 15/84  bone]
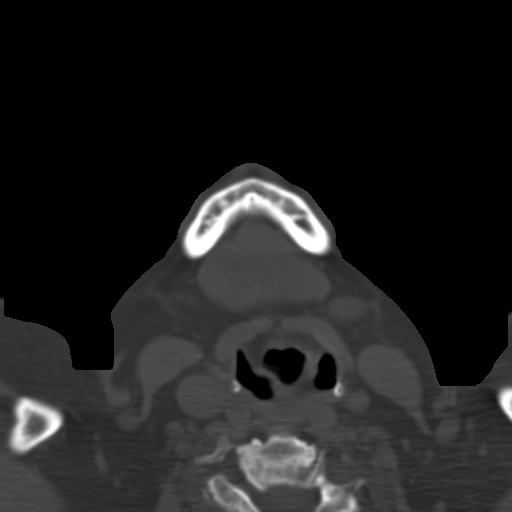
[im 23/84  bone]
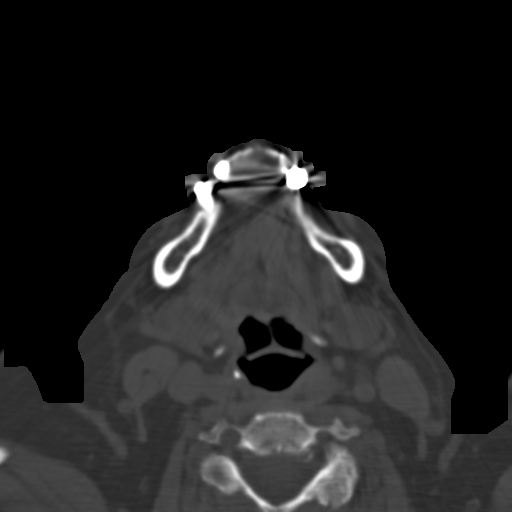
[im 29/84  bone]
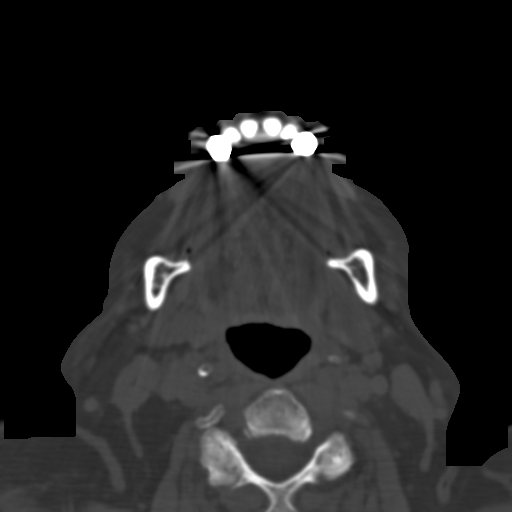
[im 38/84  brain]
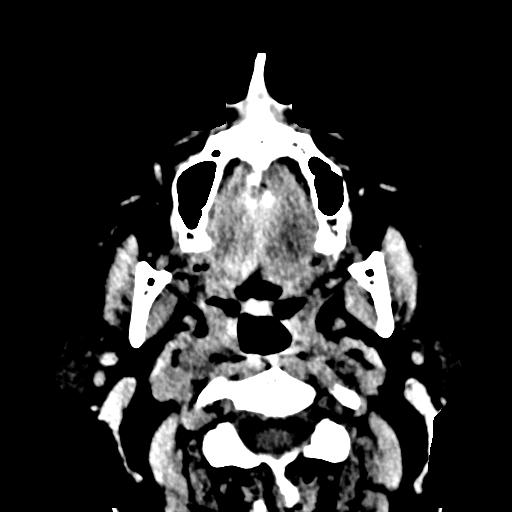
[im 38/84  bone]
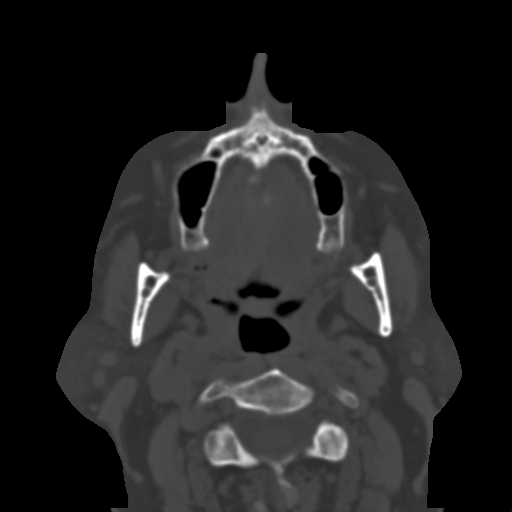
[im 46/84  bone]
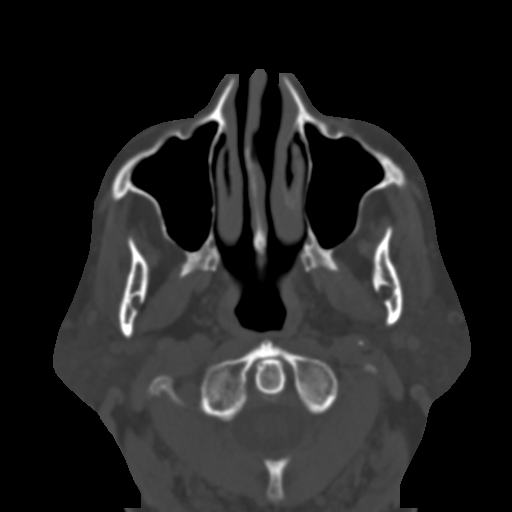
[im 55/84  bone]
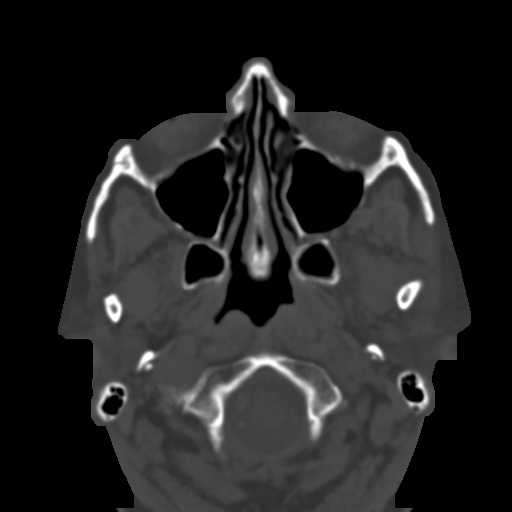
[im 63/84  bone]
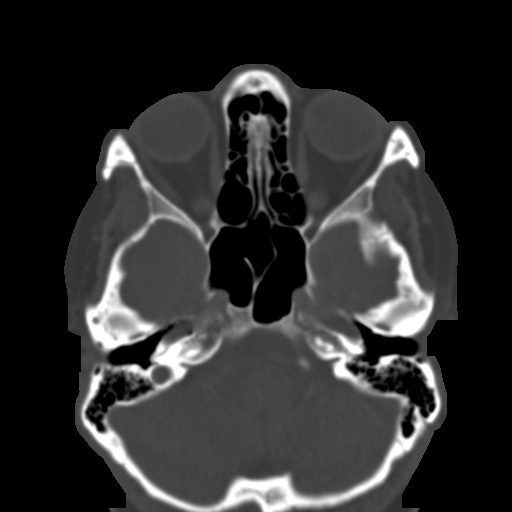
[im 69/84  brain]
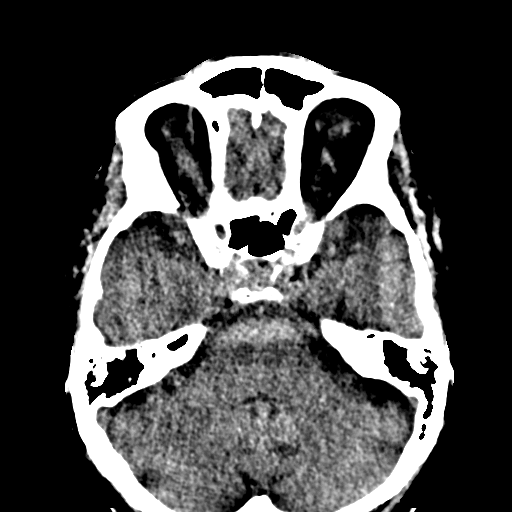
[im 69/84  bone]
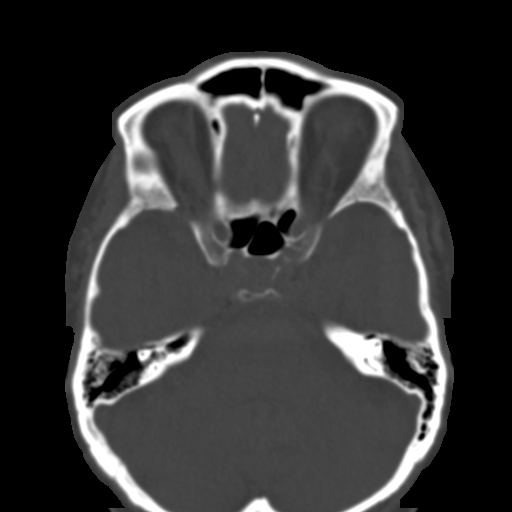
[im 78/84  bone]
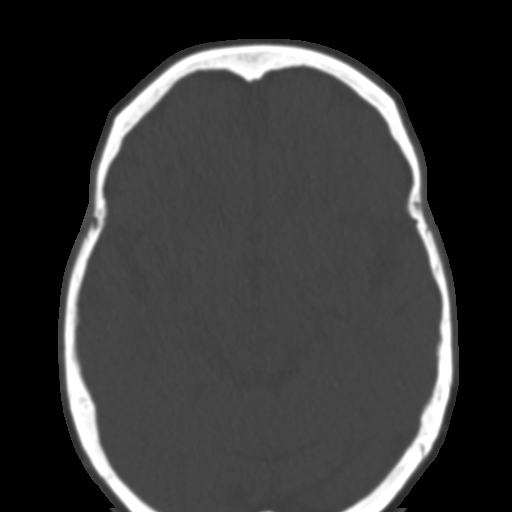

[Series 6: coronal soft · coronal · 0.37mm/px · 3 of 98 slices shown]
[im 33/98  bone]
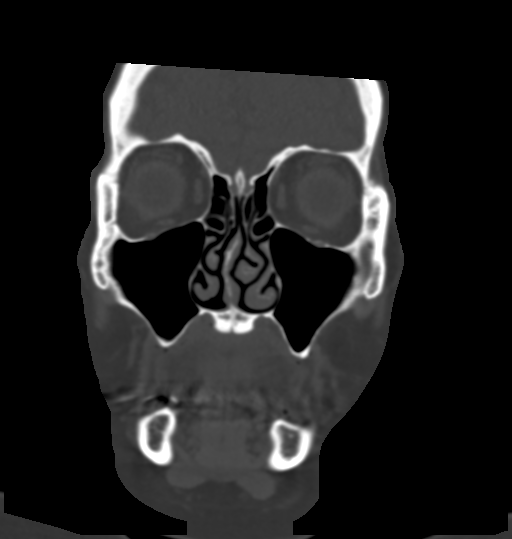
[im 44/98  bone]
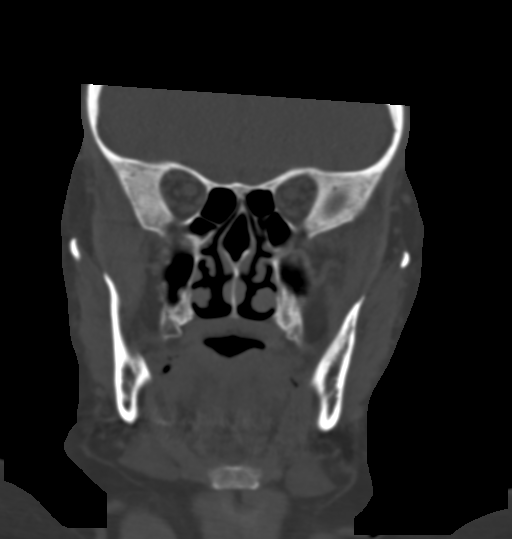
[im 54/98  bone]
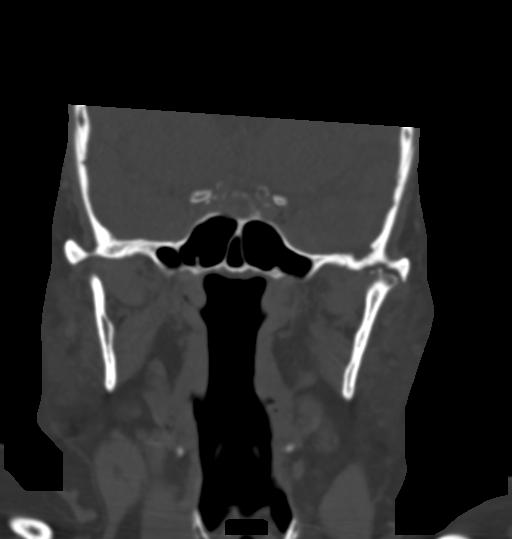

[Series 7: sagittal soft · sagittal · 0.40mm/px · 3 of 85 slices shown]
[im 29/85  bone]
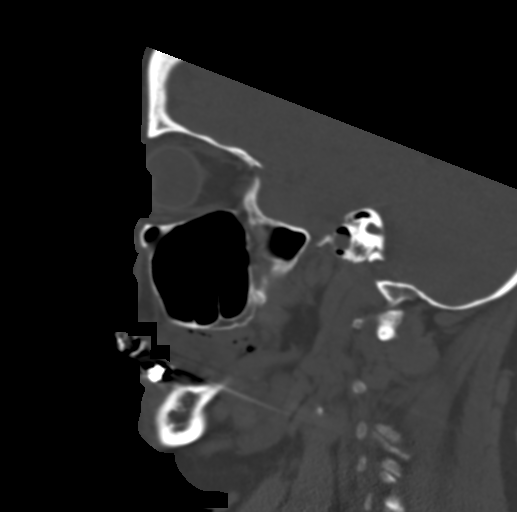
[im 43/85  bone]
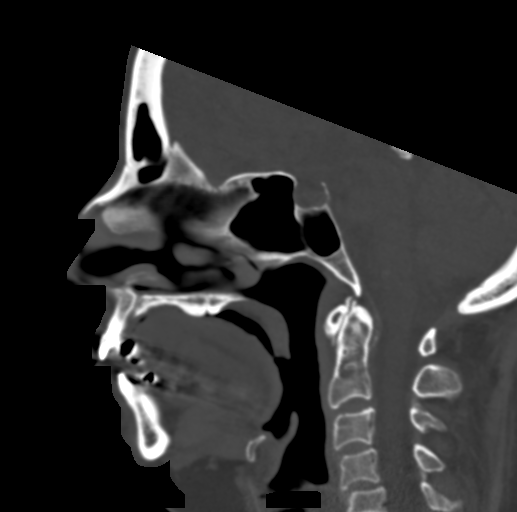
[im 57/85  bone]
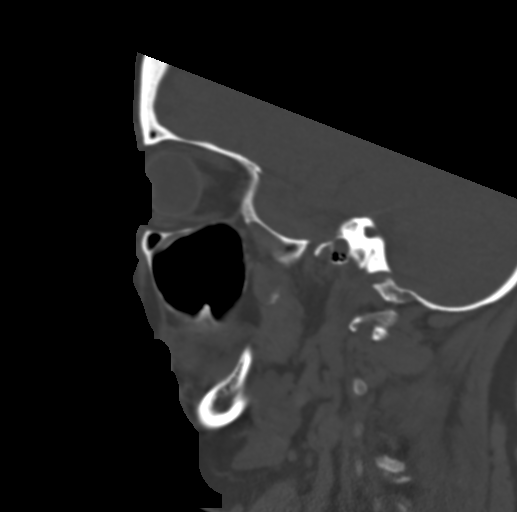

[16 of 47 positions shown; findings below may reference images not displayed]

FINDINGS: Osseous: Mastoid air cells are clear. Chronic appearing left greater
than right TMJ disease. No mandibular fracture. Pterygoid plates and
zygomatic arches are intact. No acute nasal bone fracture

Orbits: Negative. No traumatic or inflammatory finding.

Sinuses: Clear.

Soft tissues: Negative.

Limited intracranial: No significant or unexpected finding.
IMPRESSION: 1. No acute osseous abnormality.
2. Left greater than right TMJ disease with mild erosions at the
head of the mandible.

## 2022-10-22 ENCOUNTER — Encounter: Payer: Self-pay | Admitting: Dermatology

## 2022-10-22 ENCOUNTER — Ambulatory Visit: Payer: Medicare HMO | Admitting: Dermatology

## 2022-10-22 DIAGNOSIS — L578 Other skin changes due to chronic exposure to nonionizing radiation: Secondary | ICD-10-CM | POA: Diagnosis not present

## 2022-10-22 DIAGNOSIS — L729 Follicular cyst of the skin and subcutaneous tissue, unspecified: Secondary | ICD-10-CM

## 2022-10-22 DIAGNOSIS — L820 Inflamed seborrheic keratosis: Secondary | ICD-10-CM | POA: Diagnosis not present

## 2022-10-22 DIAGNOSIS — L570 Actinic keratosis: Secondary | ICD-10-CM | POA: Diagnosis not present

## 2022-10-22 DIAGNOSIS — L821 Other seborrheic keratosis: Secondary | ICD-10-CM

## 2022-10-22 DIAGNOSIS — L814 Other melanin hyperpigmentation: Secondary | ICD-10-CM | POA: Diagnosis not present

## 2022-10-22 DIAGNOSIS — W908XXA Exposure to other nonionizing radiation, initial encounter: Secondary | ICD-10-CM

## 2022-10-22 DIAGNOSIS — L720 Epidermal cyst: Secondary | ICD-10-CM | POA: Diagnosis not present

## 2022-10-22 NOTE — Patient Instructions (Signed)

## 2022-10-22 NOTE — Progress Notes (Signed)
Follow-Up Visit   Subjective  Robin STRENG is a 77 y.o. female who presents for the following: 6 month AK follow up. Face. Tx with LN2 at last visit.   The patient has spots, moles and lesions to be evaluated, some may be new or changing and the patient may have concern these could be cancer.   The following portions of the chart were reviewed this encounter and updated as appropriate: medications, allergies, medical history  Review of Systems:  No other skin or systemic complaints except as noted in HPI or Assessment and Plan.  Objective  Well appearing patient in no apparent distress; mood and affect are within normal limits.  A focused examination was performed of the following areas: Scalp, face, ears, neck  Relevant exam findings are noted in the Assessment and Plan.  R temple x2, L temple x1, Nasal bridge x1 (4) Erythematous thin papules/macules with gritty scale.   L vertex scalp x1 Erythematous keratotic or waxy stuck-on papule or plaque.    Assessment & Plan   Milia - tiny firm white papules - type of cyst - benign - sometimes these will clear with nightly OTC adapalene/Differin 0.1% gel or retinol. - may be extracted if symptomatic - observe   ACTINIC DAMAGE - chronic, secondary to cumulative UV radiation exposure/sun exposure over time - diffuse scaly erythematous macules with underlying dyspigmentation - Recommend daily broad spectrum sunscreen SPF 30+ to sun-exposed areas, reapply every 2 hours as needed.  - Recommend staying in the shade or wearing long sleeves, sun glasses (UVA+UVB protection) and wide brim hats (4-inch brim around the entire circumference of the hat). - Call for new or changing lesions.  LENTIGINES Exam: scattered tan macules Due to sun exposure Treatment Plan: Benign-appearing, observe. Recommend daily broad spectrum sunscreen SPF 30+ to sun-exposed areas, reapply every 2 hours as needed.  Call for any changes  SEBORRHEIC  KERATOSIS - Stuck-on, waxy, tan-brown papules and/or plaques  - Benign-appearing - Discussed benign etiology and prognosis. - Observe - Call for any changes    AK (actinic keratosis) (4) R temple x2, L temple x1, Nasal bridge x1  Actinic keratoses are precancerous spots that appear secondary to cumulative UV radiation exposure/sun exposure over time. They are chronic with expected duration over 1 year. A portion of actinic keratoses will progress to squamous cell carcinoma of the skin. It is not possible to reliably predict which spots will progress to skin cancer and so treatment is recommended to prevent development of skin cancer.  Recommend daily broad spectrum sunscreen SPF 30+ to sun-exposed areas, reapply every 2 hours as needed.  Recommend staying in the shade or wearing long sleeves, sun glasses (UVA+UVB protection) and wide brim hats (4-inch brim around the entire circumference of the hat). Call for new or changing lesions.  Destruction of lesion - R temple x2, L temple x1, Nasal bridge x1 (4) Complexity: simple   Destruction method: cryotherapy   Informed consent: discussed and consent obtained   Timeout:  patient name, date of birth, surgical site, and procedure verified Lesion destroyed using liquid nitrogen: Yes   Region frozen until ice ball extended beyond lesion: Yes   Outcome: patient tolerated procedure well with no complications   Post-procedure details: wound care instructions given   Additional details:  Prior to procedure, discussed risks of blister formation, small wound, skin dyspigmentation, or rare scar following cryotherapy. Recommend Vaseline ointment to treated areas while healing.   Inflamed seborrheic keratosis L vertex scalp x1  Symptomatic, irritating, patient would like treated.  Destruction of lesion - L vertex scalp x1 Complexity: simple   Destruction method: cryotherapy   Informed consent: discussed and consent obtained   Timeout:  patient  name, date of birth, surgical site, and procedure verified Lesion destroyed using liquid nitrogen: Yes   Region frozen until ice ball extended beyond lesion: Yes   Outcome: patient tolerated procedure well with no complications   Post-procedure details: wound care instructions given   Additional details:  Prior to procedure, discussed risks of blister formation, small wound, skin dyspigmentation, or rare scar following cryotherapy. Recommend Vaseline ointment to treated areas while healing.     Return in about 1 year (around 10/22/2023) for AK Follow Up, TBSE.  I, Lawson Radar, CMA, am acting as scribe for Armida Sans, MD.   Documentation: I have reviewed the above documentation for accuracy and completeness, and I agree with the above.  Armida Sans, MD

## 2022-10-28 ENCOUNTER — Encounter: Payer: Self-pay | Admitting: Dermatology

## 2023-01-15 DIAGNOSIS — I10 Essential (primary) hypertension: Secondary | ICD-10-CM | POA: Diagnosis not present

## 2023-01-15 DIAGNOSIS — M109 Gout, unspecified: Secondary | ICD-10-CM | POA: Diagnosis not present

## 2023-01-15 DIAGNOSIS — E7800 Pure hypercholesterolemia, unspecified: Secondary | ICD-10-CM | POA: Diagnosis not present

## 2023-01-15 DIAGNOSIS — I4891 Unspecified atrial fibrillation: Secondary | ICD-10-CM | POA: Diagnosis not present

## 2023-01-15 DIAGNOSIS — N1831 Chronic kidney disease, stage 3a: Secondary | ICD-10-CM | POA: Diagnosis not present

## 2023-01-15 DIAGNOSIS — R7302 Impaired glucose tolerance (oral): Secondary | ICD-10-CM | POA: Diagnosis not present

## 2023-01-15 DIAGNOSIS — R413 Other amnesia: Secondary | ICD-10-CM | POA: Diagnosis not present

## 2023-01-15 DIAGNOSIS — M8580 Other specified disorders of bone density and structure, unspecified site: Secondary | ICD-10-CM | POA: Diagnosis not present

## 2023-01-15 DIAGNOSIS — Z0000 Encounter for general adult medical examination without abnormal findings: Secondary | ICD-10-CM | POA: Diagnosis not present

## 2023-01-15 DIAGNOSIS — F419 Anxiety disorder, unspecified: Secondary | ICD-10-CM | POA: Diagnosis not present

## 2023-01-20 DIAGNOSIS — H5213 Myopia, bilateral: Secondary | ICD-10-CM | POA: Diagnosis not present

## 2023-01-20 DIAGNOSIS — H2513 Age-related nuclear cataract, bilateral: Secondary | ICD-10-CM | POA: Diagnosis not present

## 2023-01-20 DIAGNOSIS — H04123 Dry eye syndrome of bilateral lacrimal glands: Secondary | ICD-10-CM | POA: Diagnosis not present

## 2023-01-22 DIAGNOSIS — E7800 Pure hypercholesterolemia, unspecified: Secondary | ICD-10-CM | POA: Diagnosis not present

## 2023-01-22 DIAGNOSIS — R413 Other amnesia: Secondary | ICD-10-CM | POA: Diagnosis not present

## 2023-01-22 DIAGNOSIS — Z0000 Encounter for general adult medical examination without abnormal findings: Secondary | ICD-10-CM | POA: Diagnosis not present

## 2023-01-22 DIAGNOSIS — R7302 Impaired glucose tolerance (oral): Secondary | ICD-10-CM | POA: Diagnosis not present

## 2023-01-22 DIAGNOSIS — I4821 Permanent atrial fibrillation: Secondary | ICD-10-CM | POA: Diagnosis not present

## 2023-02-09 DIAGNOSIS — H2511 Age-related nuclear cataract, right eye: Secondary | ICD-10-CM | POA: Diagnosis not present

## 2023-02-09 DIAGNOSIS — H25043 Posterior subcapsular polar age-related cataract, bilateral: Secondary | ICD-10-CM | POA: Diagnosis not present

## 2023-02-09 DIAGNOSIS — H25013 Cortical age-related cataract, bilateral: Secondary | ICD-10-CM | POA: Diagnosis not present

## 2023-02-09 DIAGNOSIS — I10 Essential (primary) hypertension: Secondary | ICD-10-CM | POA: Diagnosis not present

## 2023-02-09 DIAGNOSIS — H2513 Age-related nuclear cataract, bilateral: Secondary | ICD-10-CM | POA: Diagnosis not present

## 2023-02-19 DIAGNOSIS — H2511 Age-related nuclear cataract, right eye: Secondary | ICD-10-CM | POA: Diagnosis not present

## 2023-02-20 DIAGNOSIS — H2512 Age-related nuclear cataract, left eye: Secondary | ICD-10-CM | POA: Diagnosis not present

## 2023-02-20 DIAGNOSIS — H25042 Posterior subcapsular polar age-related cataract, left eye: Secondary | ICD-10-CM | POA: Diagnosis not present

## 2023-02-20 DIAGNOSIS — H25012 Cortical age-related cataract, left eye: Secondary | ICD-10-CM | POA: Diagnosis not present

## 2023-02-23 DIAGNOSIS — Z7901 Long term (current) use of anticoagulants: Secondary | ICD-10-CM | POA: Diagnosis not present

## 2023-02-23 DIAGNOSIS — I4821 Permanent atrial fibrillation: Secondary | ICD-10-CM | POA: Diagnosis not present

## 2023-02-25 DIAGNOSIS — H2511 Age-related nuclear cataract, right eye: Secondary | ICD-10-CM | POA: Diagnosis not present

## 2023-03-03 DIAGNOSIS — G3184 Mild cognitive impairment, so stated: Secondary | ICD-10-CM | POA: Diagnosis not present

## 2023-03-03 DIAGNOSIS — R5381 Other malaise: Secondary | ICD-10-CM | POA: Diagnosis not present

## 2023-03-03 DIAGNOSIS — R0683 Snoring: Secondary | ICD-10-CM | POA: Diagnosis not present

## 2023-03-10 ENCOUNTER — Other Ambulatory Visit: Payer: Self-pay | Admitting: Neurology

## 2023-03-10 DIAGNOSIS — G3184 Mild cognitive impairment, so stated: Secondary | ICD-10-CM

## 2023-03-12 DIAGNOSIS — E785 Hyperlipidemia, unspecified: Secondary | ICD-10-CM | POA: Diagnosis not present

## 2023-03-12 DIAGNOSIS — Z9181 History of falling: Secondary | ICD-10-CM | POA: Diagnosis not present

## 2023-03-12 DIAGNOSIS — F32A Depression, unspecified: Secondary | ICD-10-CM | POA: Diagnosis not present

## 2023-03-12 DIAGNOSIS — K648 Other hemorrhoids: Secondary | ICD-10-CM | POA: Diagnosis not present

## 2023-03-12 DIAGNOSIS — E538 Deficiency of other specified B group vitamins: Secondary | ICD-10-CM | POA: Diagnosis not present

## 2023-03-12 DIAGNOSIS — D631 Anemia in chronic kidney disease: Secondary | ICD-10-CM | POA: Diagnosis not present

## 2023-03-12 DIAGNOSIS — I480 Paroxysmal atrial fibrillation: Secondary | ICD-10-CM | POA: Diagnosis not present

## 2023-03-12 DIAGNOSIS — Z87891 Personal history of nicotine dependence: Secondary | ICD-10-CM | POA: Diagnosis not present

## 2023-03-12 DIAGNOSIS — M109 Gout, unspecified: Secondary | ICD-10-CM | POA: Diagnosis not present

## 2023-03-12 DIAGNOSIS — Z7901 Long term (current) use of anticoagulants: Secondary | ICD-10-CM | POA: Diagnosis not present

## 2023-03-12 DIAGNOSIS — I129 Hypertensive chronic kidney disease with stage 1 through stage 4 chronic kidney disease, or unspecified chronic kidney disease: Secondary | ICD-10-CM | POA: Diagnosis not present

## 2023-03-12 DIAGNOSIS — G3184 Mild cognitive impairment, so stated: Secondary | ICD-10-CM | POA: Diagnosis not present

## 2023-03-12 DIAGNOSIS — N183 Chronic kidney disease, stage 3 unspecified: Secondary | ICD-10-CM | POA: Diagnosis not present

## 2023-03-19 DIAGNOSIS — I4821 Permanent atrial fibrillation: Secondary | ICD-10-CM | POA: Diagnosis not present

## 2023-03-19 DIAGNOSIS — I10 Essential (primary) hypertension: Secondary | ICD-10-CM | POA: Diagnosis not present

## 2023-03-19 DIAGNOSIS — E7800 Pure hypercholesterolemia, unspecified: Secondary | ICD-10-CM | POA: Diagnosis not present

## 2023-04-02 ENCOUNTER — Ambulatory Visit
Admission: RE | Admit: 2023-04-02 | Discharge: 2023-04-02 | Disposition: A | Discharge status: Home / Self Care | Source: Ambulatory Visit | Attending: Neurology | Admitting: Neurology

## 2023-04-02 DIAGNOSIS — R413 Other amnesia: Secondary | ICD-10-CM | POA: Diagnosis not present

## 2023-04-02 DIAGNOSIS — R251 Tremor, unspecified: Secondary | ICD-10-CM | POA: Diagnosis not present

## 2023-04-02 DIAGNOSIS — G3184 Mild cognitive impairment, so stated: Secondary | ICD-10-CM

## 2023-04-16 DIAGNOSIS — I4821 Permanent atrial fibrillation: Secondary | ICD-10-CM | POA: Diagnosis not present

## 2023-04-23 DIAGNOSIS — H25012 Cortical age-related cataract, left eye: Secondary | ICD-10-CM | POA: Diagnosis not present

## 2023-04-23 DIAGNOSIS — H25042 Posterior subcapsular polar age-related cataract, left eye: Secondary | ICD-10-CM | POA: Diagnosis not present

## 2023-04-23 DIAGNOSIS — H2512 Age-related nuclear cataract, left eye: Secondary | ICD-10-CM | POA: Diagnosis not present

## 2023-04-29 DIAGNOSIS — R051 Acute cough: Secondary | ICD-10-CM | POA: Diagnosis not present

## 2023-04-29 DIAGNOSIS — R509 Fever, unspecified: Secondary | ICD-10-CM | POA: Diagnosis not present

## 2023-04-29 DIAGNOSIS — J40 Bronchitis, not specified as acute or chronic: Secondary | ICD-10-CM | POA: Diagnosis not present

## 2023-05-04 DIAGNOSIS — H2512 Age-related nuclear cataract, left eye: Secondary | ICD-10-CM | POA: Diagnosis not present

## 2023-05-21 DIAGNOSIS — Z7901 Long term (current) use of anticoagulants: Secondary | ICD-10-CM | POA: Diagnosis not present

## 2023-05-21 DIAGNOSIS — I4821 Permanent atrial fibrillation: Secondary | ICD-10-CM | POA: Diagnosis not present

## 2023-05-28 DIAGNOSIS — F0150 Vascular dementia without behavioral disturbance: Secondary | ICD-10-CM | POA: Diagnosis not present

## 2023-05-28 DIAGNOSIS — G939 Disorder of brain, unspecified: Secondary | ICD-10-CM | POA: Diagnosis not present

## 2023-05-28 DIAGNOSIS — E559 Vitamin D deficiency, unspecified: Secondary | ICD-10-CM | POA: Diagnosis not present

## 2023-05-28 DIAGNOSIS — Z1331 Encounter for screening for depression: Secondary | ICD-10-CM | POA: Diagnosis not present

## 2023-05-28 DIAGNOSIS — F0280 Dementia in other diseases classified elsewhere without behavioral disturbance: Secondary | ICD-10-CM | POA: Diagnosis not present

## 2023-05-28 DIAGNOSIS — G309 Alzheimer's disease, unspecified: Secondary | ICD-10-CM | POA: Diagnosis not present

## 2023-06-19 DIAGNOSIS — Z7901 Long term (current) use of anticoagulants: Secondary | ICD-10-CM | POA: Diagnosis not present

## 2023-06-19 DIAGNOSIS — I4821 Permanent atrial fibrillation: Secondary | ICD-10-CM | POA: Diagnosis not present

## 2023-07-21 DIAGNOSIS — I4821 Permanent atrial fibrillation: Secondary | ICD-10-CM | POA: Diagnosis not present

## 2023-07-21 DIAGNOSIS — M109 Gout, unspecified: Secondary | ICD-10-CM | POA: Diagnosis not present

## 2023-07-21 DIAGNOSIS — F419 Anxiety disorder, unspecified: Secondary | ICD-10-CM | POA: Diagnosis not present

## 2023-07-21 DIAGNOSIS — M8580 Other specified disorders of bone density and structure, unspecified site: Secondary | ICD-10-CM | POA: Diagnosis not present

## 2023-07-21 DIAGNOSIS — Z1331 Encounter for screening for depression: Secondary | ICD-10-CM | POA: Diagnosis not present

## 2023-07-21 DIAGNOSIS — E7800 Pure hypercholesterolemia, unspecified: Secondary | ICD-10-CM | POA: Diagnosis not present

## 2023-07-21 DIAGNOSIS — G309 Alzheimer's disease, unspecified: Secondary | ICD-10-CM | POA: Diagnosis not present

## 2023-07-21 DIAGNOSIS — I10 Essential (primary) hypertension: Secondary | ICD-10-CM | POA: Diagnosis not present

## 2023-07-21 DIAGNOSIS — N1831 Chronic kidney disease, stage 3a: Secondary | ICD-10-CM | POA: Diagnosis not present

## 2023-07-21 DIAGNOSIS — F0150 Vascular dementia without behavioral disturbance: Secondary | ICD-10-CM | POA: Diagnosis not present

## 2023-07-21 DIAGNOSIS — R251 Tremor, unspecified: Secondary | ICD-10-CM | POA: Diagnosis not present

## 2023-07-21 DIAGNOSIS — R7302 Impaired glucose tolerance (oral): Secondary | ICD-10-CM | POA: Diagnosis not present

## 2023-07-28 DIAGNOSIS — M542 Cervicalgia: Secondary | ICD-10-CM | POA: Diagnosis not present

## 2023-07-28 DIAGNOSIS — Z1331 Encounter for screening for depression: Secondary | ICD-10-CM | POA: Diagnosis not present

## 2023-08-20 DIAGNOSIS — I4821 Permanent atrial fibrillation: Secondary | ICD-10-CM | POA: Diagnosis not present

## 2023-08-20 DIAGNOSIS — Z7901 Long term (current) use of anticoagulants: Secondary | ICD-10-CM | POA: Diagnosis not present

## 2023-09-01 DIAGNOSIS — Z7901 Long term (current) use of anticoagulants: Secondary | ICD-10-CM | POA: Diagnosis not present

## 2023-09-01 DIAGNOSIS — I4821 Permanent atrial fibrillation: Secondary | ICD-10-CM | POA: Diagnosis not present

## 2023-09-22 DIAGNOSIS — R0602 Shortness of breath: Secondary | ICD-10-CM | POA: Diagnosis not present

## 2023-09-22 DIAGNOSIS — I4821 Permanent atrial fibrillation: Secondary | ICD-10-CM | POA: Diagnosis not present

## 2023-10-06 DIAGNOSIS — R0602 Shortness of breath: Secondary | ICD-10-CM | POA: Diagnosis not present

## 2023-10-10 DIAGNOSIS — G4730 Sleep apnea, unspecified: Secondary | ICD-10-CM | POA: Diagnosis not present

## 2023-10-15 DIAGNOSIS — I4821 Permanent atrial fibrillation: Secondary | ICD-10-CM | POA: Diagnosis not present

## 2023-10-15 DIAGNOSIS — Z7901 Long term (current) use of anticoagulants: Secondary | ICD-10-CM | POA: Diagnosis not present

## 2023-10-22 ENCOUNTER — Ambulatory Visit: Payer: Medicare HMO | Admitting: Dermatology

## 2023-11-06 DIAGNOSIS — M25532 Pain in left wrist: Secondary | ICD-10-CM | POA: Diagnosis not present

## 2023-11-06 DIAGNOSIS — Z23 Encounter for immunization: Secondary | ICD-10-CM | POA: Diagnosis not present

## 2023-11-12 DIAGNOSIS — G4733 Obstructive sleep apnea (adult) (pediatric): Secondary | ICD-10-CM | POA: Diagnosis not present

## 2023-11-12 DIAGNOSIS — F0150 Vascular dementia without behavioral disturbance: Secondary | ICD-10-CM | POA: Diagnosis not present

## 2023-11-12 DIAGNOSIS — G309 Alzheimer's disease, unspecified: Secondary | ICD-10-CM | POA: Diagnosis not present

## 2023-11-12 DIAGNOSIS — Z7901 Long term (current) use of anticoagulants: Secondary | ICD-10-CM | POA: Diagnosis not present

## 2023-11-12 DIAGNOSIS — I4821 Permanent atrial fibrillation: Secondary | ICD-10-CM | POA: Diagnosis not present

## 2023-11-12 DIAGNOSIS — F0280 Dementia in other diseases classified elsewhere without behavioral disturbance: Secondary | ICD-10-CM | POA: Diagnosis not present

## 2023-11-17 DIAGNOSIS — M542 Cervicalgia: Secondary | ICD-10-CM | POA: Diagnosis not present

## 2023-11-17 DIAGNOSIS — J310 Chronic rhinitis: Secondary | ICD-10-CM | POA: Diagnosis not present

## 2023-11-17 DIAGNOSIS — Z1331 Encounter for screening for depression: Secondary | ICD-10-CM | POA: Diagnosis not present

## 2023-11-17 DIAGNOSIS — R61 Generalized hyperhidrosis: Secondary | ICD-10-CM | POA: Diagnosis not present

## 2023-11-26 DIAGNOSIS — H26493 Other secondary cataract, bilateral: Secondary | ICD-10-CM | POA: Diagnosis not present

## 2023-11-26 DIAGNOSIS — H04123 Dry eye syndrome of bilateral lacrimal glands: Secondary | ICD-10-CM | POA: Diagnosis not present

## 2023-12-01 DIAGNOSIS — I4821 Permanent atrial fibrillation: Secondary | ICD-10-CM | POA: Diagnosis not present

## 2023-12-01 DIAGNOSIS — Z7901 Long term (current) use of anticoagulants: Secondary | ICD-10-CM | POA: Diagnosis not present

## 2023-12-07 DIAGNOSIS — I4821 Permanent atrial fibrillation: Secondary | ICD-10-CM | POA: Diagnosis not present

## 2023-12-07 DIAGNOSIS — Z7901 Long term (current) use of anticoagulants: Secondary | ICD-10-CM | POA: Diagnosis not present

## 2024-01-05 ENCOUNTER — Encounter: Payer: Self-pay | Admitting: Dermatology

## 2024-01-05 ENCOUNTER — Ambulatory Visit: Admitting: Dermatology

## 2024-01-05 DIAGNOSIS — Z1283 Encounter for screening for malignant neoplasm of skin: Secondary | ICD-10-CM | POA: Diagnosis not present

## 2024-01-05 DIAGNOSIS — W908XXA Exposure to other nonionizing radiation, initial encounter: Secondary | ICD-10-CM

## 2024-01-05 DIAGNOSIS — L570 Actinic keratosis: Secondary | ICD-10-CM | POA: Diagnosis not present

## 2024-01-05 DIAGNOSIS — L578 Other skin changes due to chronic exposure to nonionizing radiation: Secondary | ICD-10-CM

## 2024-01-05 DIAGNOSIS — D1801 Hemangioma of skin and subcutaneous tissue: Secondary | ICD-10-CM | POA: Diagnosis not present

## 2024-01-05 DIAGNOSIS — L820 Inflamed seborrheic keratosis: Secondary | ICD-10-CM

## 2024-01-05 DIAGNOSIS — L821 Other seborrheic keratosis: Secondary | ICD-10-CM

## 2024-01-05 DIAGNOSIS — L814 Other melanin hyperpigmentation: Secondary | ICD-10-CM | POA: Diagnosis not present

## 2024-01-05 DIAGNOSIS — D229 Melanocytic nevi, unspecified: Secondary | ICD-10-CM

## 2024-01-05 NOTE — Progress Notes (Deleted)
" ° °  Follow-Up Visit   Subjective  Robin Allen is a 78 y.o. female who presents for the following: Skin Cancer Screening and Full Body Skin Exam  The patient presents for Total-Body Skin Exam (TBSE) for skin cancer screening and mole check. The patient has spots, moles and lesions to be evaluated, some may be new or changing and the patient may have concern these could be cancer.    The following portions of the chart were reviewed this encounter and updated as appropriate: medications, allergies, medical history  Review of Systems:  No other skin or systemic complaints except as noted in HPI or Assessment and Plan.  Objective  Well appearing patient in no apparent distress; mood and affect are within normal limits.  A full examination was performed including scalp, head, eyes, ears, nose, lips, neck, chest, axillae, abdomen, back, buttocks, bilateral upper extremities, bilateral lower extremities, hands, feet, fingers, toes, fingernails, and toenails. All findings within normal limits unless otherwise noted below.   Relevant physical exam findings are noted in the Assessment and Plan.    Assessment & Plan   SKIN CANCER SCREENING PERFORMED TODAY.  ACTINIC DAMAGE - Chronic condition, secondary to cumulative UV/sun exposure - diffuse scaly erythematous macules with underlying dyspigmentation - Recommend daily broad spectrum sunscreen SPF 30+ to sun-exposed areas, reapply every 2 hours as needed.  - Staying in the shade or wearing long sleeves, sun glasses (UVA+UVB protection) and wide brim hats (4-inch brim around the entire circumference of the hat) are also recommended for sun protection.  - Call for new or changing lesions.  LENTIGINES, SEBORRHEIC KERATOSES, HEMANGIOMAS - Benign normal skin lesions - Benign-appearing - Call for any changes  MELANOCYTIC NEVI - Tan-brown and/or pink-flesh-colored symmetric macules and papules - Benign appearing on exam today -  Observation - Call clinic for new or changing moles - Recommend daily use of broad spectrum spf 30+ sunscreen to sun-exposed areas.        No follow-ups on file.  [name], CMA, am acting as scribe for Alm Rhyme, MD .   Documentation: I have reviewed the above documentation for accuracy and completeness, and I agree with the above.  Alm Rhyme, MD    "

## 2024-01-05 NOTE — Patient Instructions (Addendum)

## 2024-01-05 NOTE — Progress Notes (Signed)
" ° °  Follow-Up Visit   Subjective  Robin Allen is a 78 y.o. female who presents for the following: Skin Cancer Screening and Full Body Skin Exam, hx of precancers  The patient presents for Total-Body Skin Exam (TBSE) for skin cancer screening and mole check. The patient has spots, moles and lesions to be evaluated, some may be new or changing and the patient may have concern these could be cancer.  The following portions of the chart were reviewed this encounter and updated as appropriate: medications, allergies, medical history  Review of Systems:  No other skin or systemic complaints except as noted in HPI or Assessment and Plan.  Objective  Well appearing patient in no apparent distress; mood and affect are within normal limits.  A full examination was performed including scalp, head, eyes, ears, nose, lips, neck, chest, axillae, abdomen, back, buttocks, bilateral upper extremities, bilateral lower extremities, hands, feet, fingers, toes, fingernails, and toenails. All findings within normal limits unless otherwise noted below.   Relevant physical exam findings are noted in the Assessment and Plan. face, ears x 15 (15) Erythematous thin papules/macules with gritty scale.  face x 2 (2) Stuck-on, waxy, tan-brown papules and plaques -- Discussed benign etiology and prognosis.   Assessment & Plan   SKIN CANCER SCREENING PERFORMED TODAY.  LENTIGINES, SEBORRHEIC KERATOSES, HEMANGIOMAS - Benign normal skin lesions - Benign-appearing - Call for any changes  MELANOCYTIC NEVI - Tan-brown and/or pink-flesh-colored symmetric macules and papules - Benign appearing on exam today - Observation - Call clinic for new or changing moles - Recommend daily use of broad spectrum spf 30+ sunscreen to sun-exposed areas.   AK (ACTINIC KERATOSIS) (15) face, ears x 15 (15) ACTINIC DAMAGE - chronic, secondary to cumulative UV radiation exposure/sun exposure over time - diffuse scaly erythematous  macules with underlying dyspigmentation - Recommend daily broad spectrum sunscreen SPF 30+ to sun-exposed areas, reapply every 2 hours as needed.  - Recommend staying in the shade or wearing long sleeves, sun glasses (UVA+UVB protection) and wide brim hats (4-inch brim around the entire circumference of the hat). - Call for new or changing lesions.  - Destruction of lesion - face, ears x 15 (15) Complexity: simple   Destruction method: cryotherapy   Informed consent: discussed and consent obtained   Timeout:  patient name, date of birth, surgical site, and procedure verified Lesion destroyed using liquid nitrogen: Yes   Region frozen until ice ball extended beyond lesion: Yes   Outcome: patient tolerated procedure well with no complications   Post-procedure details: wound care instructions given    INFLAMED SEBORRHEIC KERATOSIS (2) face x 2 (2) Symptomatic, irritating, patient would like treated.  - Destruction of lesion - face x 2 (2) Complexity: simple   Destruction method: cryotherapy   Informed consent: discussed and consent obtained   Timeout:  patient name, date of birth, surgical site, and procedure verified Lesion destroyed using liquid nitrogen: Yes   Region frozen until ice ball extended beyond lesion: Yes   Outcome: patient tolerated procedure well with no complications   Post-procedure details: wound care instructions given      Return if symptoms worsen or fail to improve, for TBSE, hx of AKs .  IFay Allen, CMA, am acting as scribe for Alm Rhyme, MD .   Documentation: I have reviewed the above documentation for accuracy and completeness, and I agree with the above.  Alm Rhyme, MD    "
